# Patient Record
Sex: Male | Born: 1980 | Race: Black or African American | Hispanic: No | Marital: Married | Smoking: Light tobacco smoker
Health system: Southern US, Community
[De-identification: ages and names within clinical notes are randomized; demographics above are authoritative.]

## PROBLEM LIST (undated history)

## (undated) DIAGNOSIS — L509 Urticaria, unspecified: Secondary | ICD-10-CM

## (undated) DIAGNOSIS — K219 Gastro-esophageal reflux disease without esophagitis: Secondary | ICD-10-CM

## (undated) DIAGNOSIS — T7840XA Allergy, unspecified, initial encounter: Secondary | ICD-10-CM

## (undated) HISTORY — DX: Allergy, unspecified, initial encounter: T78.40XA

## (undated) HISTORY — PX: FOOT SURGERY: SHX648

## (undated) HISTORY — PX: TONSILLECTOMY AND ADENOIDECTOMY: SUR1326

## (undated) HISTORY — DX: Gastro-esophageal reflux disease without esophagitis: K21.9

## (undated) HISTORY — DX: Urticaria, unspecified: L50.9

---

## 2008-10-31 ENCOUNTER — Ambulatory Visit: Payer: Self-pay | Admitting: Internal Medicine

## 2008-10-31 DIAGNOSIS — G43109 Migraine with aura, not intractable, without status migrainosus: Secondary | ICD-10-CM | POA: Insufficient documentation

## 2008-10-31 DIAGNOSIS — J3089 Other allergic rhinitis: Secondary | ICD-10-CM | POA: Insufficient documentation

## 2008-10-31 DIAGNOSIS — F411 Generalized anxiety disorder: Secondary | ICD-10-CM | POA: Insufficient documentation

## 2008-11-09 LAB — CONVERTED CEMR LAB
ALT: 28 units/L (ref 0–53)
AST: 32 units/L (ref 0–37)
Albumin: 4.3 g/dL (ref 3.5–5.2)
Eosinophils Relative: 0.7 % (ref 0.0–5.0)
GFR calc non Af Amer: 85.84 mL/min (ref >60)
Glucose, Bld: 89 mg/dL (ref 70–99)
HCT: 49.5 % (ref 39.0–52.0)
Hemoglobin: 16.2 g/dL (ref 13.0–17.0)
LDL Cholesterol: 108 mg/dL — ABNORMAL HIGH (ref 0–99)
Lymphs Abs: 1.9 10*3/uL (ref 0.7–4.0)
Monocytes Relative: 9.4 % (ref 3.0–12.0)
Neutro Abs: 4.9 10*3/uL (ref 1.4–7.7)
Potassium: 4.1 meq/L (ref 3.5–5.1)
Sodium: 140 meq/L (ref 135–145)
TSH: 0.44 microintl units/mL (ref 0.35–5.50)
VLDL: 16.6 mg/dL (ref 0.0–40.0)
WBC: 7.6 10*3/uL (ref 4.5–10.5)

## 2008-11-11 ENCOUNTER — Encounter (INDEPENDENT_AMBULATORY_CARE_PROVIDER_SITE_OTHER): Payer: Self-pay | Admitting: *Deleted

## 2011-12-19 ENCOUNTER — Encounter: Payer: Self-pay | Admitting: Internal Medicine

## 2011-12-19 ENCOUNTER — Ambulatory Visit (INDEPENDENT_AMBULATORY_CARE_PROVIDER_SITE_OTHER): Payer: 59 | Admitting: Internal Medicine

## 2011-12-19 VITALS — BP 132/92 | HR 92 | Wt 235.0 lb

## 2011-12-19 DIAGNOSIS — M542 Cervicalgia: Secondary | ICD-10-CM

## 2011-12-19 DIAGNOSIS — G5600 Carpal tunnel syndrome, unspecified upper limb: Secondary | ICD-10-CM

## 2011-12-19 MED ORDER — TRAMADOL HCL 50 MG PO TABS: 50.0000 mg | ORAL_TABLET | Freq: Three times a day (TID) | ORAL | Status: DC | PRN

## 2011-12-19 MED ORDER — CYCLOBENZAPRINE HCL 5 MG PO TABS: ORAL_TABLET | ORAL | Status: DC

## 2011-12-19 NOTE — Patient Instructions (Addendum)
Use an anti-inflammatory cream such as Aspercreme or Zostrix cream twice a day to the left neck as needed. In lieu of this warm moist compresses or  hot water bottle can be used. Do not apply ice . Use a cervical pillow @ night.  Go to [facility] for information on Carpal Tunnel Syndrome. If symptoms persist or progress; nerve conduction/EMG studies would be indicated. If these were abnormal, Hand Surgery referral would be indicated.

## 2011-12-19 NOTE — Progress Notes (Signed)
Subjective:    Patient ID: Johnny Yang, male    DOB: 08/09/1980, 31 y.o.   MRN: [id number]  HPI Symptoms began approximately 2 weeks ago while he was out of town, upon awakening. The pain was localized in the left neck area and described as sharp. It persisted over several days. It did gradually improve but has been recurring with left lateral neck rotation or neck flexion. There is no history of repetitive motion, overuse, hyperextension, or trauma. He has had some associated left trapezius area pain.    Review of Systems There's been no associated numbness, tingling, or weakness in the extremity. He does have occasional numbness in the left hand.  He is right-handed. His job does not involve repetitive hand motion. There's been no associated fever, chills, or weight loss. He's had no associated bowel or bladder dysfunction.    Additionally she's concerned about a "lump" in the testicular area. It has been present several years but may be enlarging. There is no associated color or temperature change of the skin, rash, dysuria, pyuria, or hematuria.     Objective:   Physical Exam Gen.: Very fit,healthy and well-nourished in appearance. Alert, appropriate and cooperative throughout exam. Head: Normocephalic without obvious abnormalities Eyes: No corneal or conjunctival inflammation noted. Pupils equal round reactive to light and accommodation.  Extraocular motion intact.  Nose: External nasal exam reveals no deformity or inflammation. Nasal mucosa are pink and moist. No lesions or exudates noted.   Mouth: Oral mucosa and oropharynx reveal no lesions or exudates. Teeth in good repair. Neck: No deformities, masses, or tenderness noted. Range of motion DECREASED laterally & with flexion. Thyroid normal. Lungs: Normal respiratory effort; chest expands symmetrically. Lungs are clear to auscultation without rales, wheezes, or increased work of breathing. Heart: Normal rate and rhythm.  Normal S1 and S2. No gallop, click, or rub. S4 w/o murmur. Abdomen: Bowel sounds normal; abdomen soft and nontender. No masses, organomegaly or hernias noted. Genitalia:An agent such as Qvar is a preventive or prophylactic medication used to control inflammation which is a major driving force in asthma. The dose is 2 puffs every 12 hours with acute flares; maintenance dose will be 1 puff daily to  every 12 hours as needed. Gargle & spit after its use When you refill the medications; verify which is the preferred option on your plan.                                                                Musculoskeletal/extremities:There is some asymmetry of the posterior thoracic musculature (L > R) suggesting occult scoliosis.  No clubbing, cyanosis, edema, or deformity noted. Range of motion  normal .Tone & strength  normal.Joints normal. Nail health  good. Vascular: Carotid &radial artery pulses are full and equal. No bruits present. Neurologic: Alert and oriented x3. Deep tendon reflexes symmetrical and normal.  No cervical nerve deficit. + Tinel's bilaterally        Skin: Intact without suspicious lesions or rashes.6X 2.5 cm nevus R abdomen Lymph: No cervical, axillary, or inguinal lymphadenopathy present. Psych: Mood and affect are normal. 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  #4 occult scoliosis unrelated to #1  Plan: See orders and recommendations.

## 2011-12-22 ENCOUNTER — Telehealth: Payer: Self-pay

## 2011-12-22 ENCOUNTER — Ambulatory Visit: Payer: 59 | Attending: Internal Medicine | Admitting: Rehabilitation

## 2011-12-22 DIAGNOSIS — M545 Low back pain, unspecified: Secondary | ICD-10-CM | POA: Insufficient documentation

## 2011-12-22 DIAGNOSIS — IMO0001 Reserved for inherently not codable concepts without codable children: Secondary | ICD-10-CM | POA: Insufficient documentation

## 2011-12-22 DIAGNOSIS — M542 Cervicalgia: Secondary | ICD-10-CM | POA: Insufficient documentation

## 2011-12-22 NOTE — Telephone Encounter (Signed)
Pt left message on triage line wanting lab results. i called pt back concerning results left message.    MW

## 2011-12-26 ENCOUNTER — Ambulatory Visit: Payer: 59 | Admitting: Rehabilitation

## 2011-12-28 ENCOUNTER — Ambulatory Visit: Payer: 59 | Admitting: Rehabilitation

## 2011-12-30 ENCOUNTER — Ambulatory Visit: Payer: 59 | Attending: Internal Medicine | Admitting: Rehabilitation

## 2011-12-30 DIAGNOSIS — IMO0001 Reserved for inherently not codable concepts without codable children: Secondary | ICD-10-CM | POA: Insufficient documentation

## 2011-12-30 DIAGNOSIS — M542 Cervicalgia: Secondary | ICD-10-CM | POA: Insufficient documentation

## 2011-12-30 DIAGNOSIS — M545 Low back pain, unspecified: Secondary | ICD-10-CM | POA: Insufficient documentation

## 2012-01-02 ENCOUNTER — Other Ambulatory Visit: Payer: Self-pay | Admitting: Internal Medicine

## 2012-01-02 DIAGNOSIS — G8929 Other chronic pain: Secondary | ICD-10-CM

## 2012-01-03 ENCOUNTER — Ambulatory Visit: Payer: 59 | Admitting: Rehabilitation

## 2012-01-04 ENCOUNTER — Ambulatory Visit: Payer: 59 | Admitting: Rehabilitation

## 2012-01-05 ENCOUNTER — Telehealth: Payer: Self-pay

## 2012-01-05 NOTE — Telephone Encounter (Signed)
I have contacted patient several times yesterday and today and unable to leave message for recording states mailbox ful. I contact patient's job yesterday and today and was informed patient not in either day.  I called patient's emergency contact (Father) and informed him to please let his son know that we are trying to contact him (Non-urgent) call. Patient's father Peyton Najjar) indicated he will make him aware.   I will continue to try to reach patient tomorrow

## 2012-01-05 NOTE — Telephone Encounter (Signed)
Message copied by Maurice Small on Thu Jan 05, 2012  5:08 PM ------      Message from: Pecola Lawless      Created: Mon Jan 02, 2012  7:03 AM       Please let him know that films of lumbar spine ordered it about our even; no appointment necessary. This was requested by rehab

## 2012-01-06 NOTE — Telephone Encounter (Signed)
Left message on voicemail informing patient orders placed. Patient to call if he has questions or concerns

## 2012-03-08 ENCOUNTER — Encounter: Payer: Self-pay | Admitting: Internal Medicine

## 2012-03-08 ENCOUNTER — Ambulatory Visit (INDEPENDENT_AMBULATORY_CARE_PROVIDER_SITE_OTHER): Payer: 59 | Admitting: Internal Medicine

## 2012-03-08 VITALS — BP 126/98 | HR 80 | Wt 225.2 lb

## 2012-03-08 DIAGNOSIS — L259 Unspecified contact dermatitis, unspecified cause: Secondary | ICD-10-CM

## 2012-03-08 DIAGNOSIS — L299 Pruritus, unspecified: Secondary | ICD-10-CM

## 2012-03-08 MED ORDER — RANITIDINE HCL 150 MG PO TABS: 150.0000 mg | ORAL_TABLET | Freq: Two times a day (BID) | ORAL | Status: DC

## 2012-03-08 MED ORDER — PREDNISONE 20 MG PO TABS: 20.0000 mg | ORAL_TABLET | Freq: Two times a day (BID) | ORAL | Status: DC

## 2012-03-08 MED ORDER — HYDROXYZINE HCL 10 MG PO TABS: 10.0000 mg | ORAL_TABLET | Freq: Four times a day (QID) | ORAL | Status: DC | PRN

## 2012-03-08 NOTE — Progress Notes (Signed)
  Subjective:    Patient ID: Johnny Yang, male    DOB: Sep 19, 1980, 32 y.o.   MRN: [id number]  HPI RASH: Location: arms  Onset: > 4 weeks ago   Course: intermittent Self-treated with: frequent showers       His fiance was in the emergency room last night for diffuse rash over her thorax. No definite etiology was determined                  Review of Systems Pruritis: itching has been constant even when rash not present Tenderness: yes New medications/antibiotics:no Tick/insect/pet exposure:no but questions relationship to petting dog  Recent travel: no New detergent, new clothing, or other topical exposure: no  Red Flags Feeling ill: no Fever/chills/sweats: no Mouth lesions: no Angioedema/ wheezing: some wheezing . No PMH of asthma      Objective:   Physical Exam General appearance:good health ;well nourished; no acute distress or increased work of breathing is present.  No  lymphadenopathy about the head, neck, or axilla noted.   Eyes: No conjunctival inflammation or lid edema is present. There is no scleral icterus.  Ears:  External ear exam shows no significant lesions or deformities.  Some wax bilaterally  Nose:  External nasal examination shows no deformity or inflammation. Nasal mucosa are pink and moist without lesions or exudates. No septal dislocation or deviation.No obstruction to airflow.   Oral exam: Dental hygiene is good; lips and gums are healthy appearing.There is no oropharyngeal erythema or exudate noted.   Neck:  No deformities,  masses, or tenderness noted.     Heart:  Normal rate and regular rhythm. S1 and S2 normal without gallop, murmur, click, rub or other extra sounds.   Lungs:Chest clear to auscultation; no wheezes, rhonchi,rales ,or rubs present.No increased work of breathing.    Extremities:  No cyanosis, edema, or clubbing  noted    Skin: Warm & dry . He has scattered faint excoriations over his back and forearms. Dermatographia  could not be elicited        Assessment & Plan:  #1 intermittent rash over the forearms; his possibly should have possible contact dermatitis related to the dog is quite possible. Vet appt pending  #2 persistent colitis even when rash absent  #3 history of wheezing; no evidence of reactive airways disease or angioedema/urticaria present  Plan: See orders and recommendations

## 2012-03-08 NOTE — Patient Instructions (Addendum)
Avoid colognes and cosmetics which are not hypoallergenic. Restrict hyperallergenic foods at this time: Nuts, strawberries, seafood , chocolate, and tomatoes. Use  Aveeno Daily  Moisturizing Lotion  twice a day  for the dry skin. Bathe with moisturizing liquid soap , not bar soap.

## 2012-11-23 ENCOUNTER — Ambulatory Visit (INDEPENDENT_AMBULATORY_CARE_PROVIDER_SITE_OTHER): Payer: 59 | Admitting: Internal Medicine

## 2012-11-23 ENCOUNTER — Encounter: Payer: Self-pay | Admitting: Internal Medicine

## 2012-11-23 VITALS — BP 129/94 | HR 73 | Temp 98.6°F | Resp 18 | Wt 225.0 lb

## 2012-11-23 DIAGNOSIS — G43909 Migraine, unspecified, not intractable, without status migrainosus: Secondary | ICD-10-CM

## 2012-11-23 DIAGNOSIS — I861 Scrotal varices: Secondary | ICD-10-CM

## 2012-11-23 DIAGNOSIS — G43809 Other migraine, not intractable, without status migrainosus: Secondary | ICD-10-CM

## 2012-11-23 DIAGNOSIS — G43109 Migraine with aura, not intractable, without status migrainosus: Secondary | ICD-10-CM

## 2012-11-23 MED ORDER — SUMATRIPTAN SUCCINATE 100 MG PO TABS: ORAL_TABLET | ORAL | Status: DC

## 2012-11-23 NOTE — Progress Notes (Signed)
Subjective:    Patient ID: Johnny Yang, male    DOB: [birth year], 32 y.o.   MRN: [id number]  HPI  He's had exacerbation of headaches this week; he's had this intermittently over the past several years.  There was no particular trigger or eliciting event for headaches such as prolonged computer use, trauma, change in barometric pressure, or sinus infection  The headaches are mainly in the left temple extending to the left occiput. At times he's had some headache frontally at the base of the nose  The headache is described as sharp, up to a level VII and lasting hours. It is nonradiating  Taking ibuprofen 800 mg one-2 per day and sleeping help the headaches.  He is noted some blurred vision and "stars"  as a prodrome prior to the headache. Light and sound and aggravate headaches.  There is a family history migraine in an uncle   Review of Systems  He denies diplopia or loss of vision. He's had no excess rhinitis or tearing of his eyes. He  Is no associated tinnitus or hearing loss  He has no balance issues or vertigo.  There's been no weakness, numbness, tingling in his arms or legs.  As noted he does not have discolored nasal drainage, fever, or chills. He has intermittent night sweats. No weight loss. ? scrotal "pebbles"; no dysuria , pyuria , or hematuria  There has  been no rash or change in color or temperature in the area of pain.  He denies any bleeding dyscrasias.     Objective:   Physical Exam Gen.: Healthy and well-nourished in appearance. Alert, appropriate and cooperative throughout exam.Appears younger than stated age  Head: Normocephalic without obvious abnormalities  Eyes: No corneal or conjunctival inflammation noted. Pupils equal round reactive to light and accommodation. FOV. Extraocular motion intact. Vision grossly normal without lenses Ears: External  ear exam reveals no significant lesions or deformities. Canals clear .TMs normal. Hearing is  grossly normal bilaterally. Nose: External nasal exam reveals no deformity or inflammation. Nasal mucosa are pink and moist. No lesions or exudates noted.  Mouth: Oral mucosa and oropharynx reveal no lesions or exudates. Teeth in good repair. Neck: No deformities, masses, or tenderness noted. Range of motion & Thyroid normal. Lungs: Normal respiratory effort; chest expands symmetrically. Lungs are clear to auscultation without rales, wheezes, or increased work of breathing. Heart: Normal rate and rhythm. Normal S1 and S2. No gallop, click, or rub. No murmur. Abdomen: Bowel sounds normal; abdomen soft and nontender. No masses, organomegaly or hernias noted. Genitalia: Genitalia normal except for left varices.  Musculoskeletal/extremities: No deformity or scoliosis noted of  the thoracic or lumbar spine.   No clubbing, cyanosis, edema, or significant extremity  deformity noted. Range of motion normal .Tone & strength  Normal. Joints normal . Nail health good. Able to lie down & sit up w/o help. Negative SLR bilaterally Vascular: Carotid, radial artery, dorsalis pedis and  posterior tibial pulses are full and equal. No bruits present. Neurologic: Alert and oriented x3. Deep tendon reflexes symmetrical and normal.  Gait normal; negative Romberg and finger to nose testing    Skin: Intact without suspicious lesions or rashes. Lymph: No cervical, axillary, or inguinal lymphadenopathy present. Psych: Mood and affect are normal. Normally interactive  Assessment & Plan:  #1classic migraine #2 scrotal varices See Orders

## 2012-11-23 NOTE — Patient Instructions (Addendum)
Please keep a diary of your headaches . Document  each occurrence on the calendar with notation of : #1 any prodrome ( any non headache symptom such as marked fatigue,visual changes, ,etc ) which precedes actual headache ; #2) severity on 1-10 scale; #3) any triggers ( food/ drink,enviromenntal or weather changes ,physical or emotional stress) in 8-12 hour period prior to the headache; & #4) response to any medications or other intervention. Please review "Headache" @ [facility] for additional information.    Excedrin Migraine can be used to prevent migraine headaches if taken at the onset of any prodrome or non-headache symptom.

## 2014-12-02 ENCOUNTER — Encounter: Payer: Self-pay | Admitting: Internal Medicine

## 2014-12-02 ENCOUNTER — Ambulatory Visit (INDEPENDENT_AMBULATORY_CARE_PROVIDER_SITE_OTHER): Payer: 59 | Admitting: Internal Medicine

## 2014-12-02 VITALS — BP 112/74 | HR 86 | Temp 98.3°F | Resp 16 | Wt 234.0 lb

## 2014-12-02 DIAGNOSIS — L929 Granulomatous disorder of the skin and subcutaneous tissue, unspecified: Secondary | ICD-10-CM

## 2014-12-02 DIAGNOSIS — L980 Pyogenic granuloma: Secondary | ICD-10-CM

## 2014-12-02 DIAGNOSIS — R1032 Left lower quadrant pain: Secondary | ICD-10-CM

## 2014-12-02 DIAGNOSIS — R1030 Lower abdominal pain, unspecified: Secondary | ICD-10-CM

## 2014-12-02 MED ORDER — TRAMADOL HCL 50 MG PO TABS: 50.0000 mg | ORAL_TABLET | Freq: Three times a day (TID) | ORAL | Status: DC | PRN

## 2014-12-02 MED ORDER — MUPIROCIN 2 % EX OINT: TOPICAL_OINTMENT | CUTANEOUS | Status: DC

## 2014-12-02 NOTE — Progress Notes (Signed)
   Subjective:    Patient ID: Johnny Yang, male    DOB: 1981-02-27, 34 y.o.   MRN: [id number]  HPI  He has had left inguinal groin pain for 2 weeks; it can be as severe as a level VIII. There's been no injury or trauma to explain this. He did have a history of varices in the scrotum @ age of 23. He noted what he thought was an enlarged vein on the left scrotum when he checked himself in the shower.  He has no constitutional, GI or GU symptoms.   Review of Systems Dysuria, pyuria, hematuria, frequency, nocturia or polyuria are denied.Unexplained weight loss, abdominal pain, significant dyspepsia, dysphagia, melena, rectal bleeding, or persistently small caliber stools are denied.    Objective:   Physical Exam Pertinent or positive findings include: There is a subcutaneous granuloma at the base of the left scrotum. There is no vesicle or pustule formation. With compression there is no tenderness. It's approximately pea-sized. He has no associated inguinal lymphadenopathy. He has pain with external rotation of the left hip in the inguinal area.  General appearance :adequately nourished; in no distress.  Eyes: No conjunctival inflammation or scleral icterus is present.   Heart:  Normal rate and regular rhythm. S1 and S2 normal without gallop, murmur, click, rub or other extra sounds    Lungs:Chest clear to auscultation; no wheezes, rhonchi,rales ,or rubs present.No increased work of breathing.   Abdomen: bowel sounds normal, soft and non-tender without masses, organomegaly or hernias noted.  No guarding or rebound. No flank tenderness to percussion.  Vascular : all pulses equal ; no bruits present.  Skin:Warm & dry.  Intact without suspicious lesions or rashes ; no tenting    Lymphatic: No lymphadenopathy is noted about the head, neck, axilla.   Neuro: Strength, tone & DTRs normal.     Assessment & Plan:  #1 left inguinal ligamentous soft tissue injury  #2 granuloma left  scrotum  Plan: See orders and recommendations

## 2014-12-02 NOTE — Patient Instructions (Signed)
  Use the Cybex or Nautilus machines with low weights (25 pounds or less) to stretch the tendon. Performing the frog kick maneuver while holding onto the side of the swimming pool is also recommended.Consider glucosamine sulfate 1500 mg daily for 2-4 weeks. This will rehydrate the tendons & cartilages.

## 2014-12-02 NOTE — Progress Notes (Signed)
Pre visit review using our clinic review tool, if applicable. No additional management support is needed unless otherwise documented below in the visit note. 

## 2015-05-28 ENCOUNTER — Encounter: Payer: Self-pay | Admitting: Family

## 2015-05-28 ENCOUNTER — Ambulatory Visit (INDEPENDENT_AMBULATORY_CARE_PROVIDER_SITE_OTHER): Payer: 59 | Admitting: Family

## 2015-05-28 VITALS — BP 120/88 | HR 92 | Temp 98.0°F | Resp 16 | Ht 74.0 in | Wt 243.0 lb

## 2015-05-28 DIAGNOSIS — N50819 Testicular pain, unspecified: Secondary | ICD-10-CM | POA: Insufficient documentation

## 2015-05-28 MED ORDER — NAPROXEN 500 MG PO TABS: 500.0000 mg | ORAL_TABLET | Freq: Two times a day (BID) | ORAL | Status: DC

## 2015-05-28 NOTE — Progress Notes (Signed)
   Subjective:    Patient ID: Johnny Yang, male    DOB: March 11, 1980, 35 y.o.   MRN: [id number]  Chief Complaint  Patient presents with  . Establish Care    Has some issues with testicles    HPI:  Johnny NoraJohn Allen Yang is a 35 y.o. male who  has a past medical history of Allergy. and presents today  For an office visit.  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 The current problem started about a couple of months ago and describes the associated symptoms of testicular discomfort which is aggravated when he lies on the left side. Pain is described as sharp and achy pain. Believes it started when he was wearing normal boxers believes that aided the problem. He also describes the inability to ejaculate on occasion. No fevers.    No Known Allergies   No current outpatient prescriptions on file prior to visit.   No current facility-administered medications on file prior to visit.     Past Surgical History  Procedure Laterality Date  . Tonsillectomy and adenoidectomy        Review of Systems  Constitutional: Negative for fever and chills.  Respiratory: Negative for chest tightness and shortness of breath.   Genitourinary: Positive for testicular pain. Negative for dysuria, urgency, frequency, hematuria, flank pain, discharge, penile swelling, scrotal swelling and penile pain.      Objective:    BP 120/88 mmHg  Pulse 92  Temp(Src) 98 F (36.7 C) (Oral)  Resp 16  Ht 6\' 2"  (1.88 m)  Wt 243 lb (110.224 kg)  BMI 31.19 kg/m2  SpO2 98% Nursing note and vital signs reviewed.  Physical Exam  Constitutional: He is oriented to person, place, and time. He appears well-developed and well-nourished. No distress.  Cardiovascular: Normal rate, regular rhythm, normal heart sounds and intact distal pulses.   Pulmonary/Chest: Effort normal and breath sounds normal.  Genitourinary: Right testis shows mass. Right testis shows no swelling and no tenderness. Left  testis shows tenderness. Left testis shows no mass and no swelling.  Small firm nodule noted on right testicle.   Neurological: He is alert and oriented to person, place, and time.  Skin: Skin is warm and dry.  Psychiatric: He has a normal mood and affect. His behavior is normal. Judgment and thought content normal.       Assessment & Plan:   Problem List Items Addressed This Visit      Other   Testicular pain - Primary    Symptoms and exam consistent with possible variocele although cannot rule out spermatocele with little concern for torsion. Obtain scrotal ultrasound. Start naproxen as needed for discomfort. Encouraged to wear supportive underwear. Follow-up if symptoms worsen or fail to improve prior to ultrasound results.      Relevant Medications   naproxen (NAPROSYN) 500 MG tablet   Other Relevant Orders   US Scrotum

## 2015-05-28 NOTE — Progress Notes (Signed)
Pre visit review using our clinic review tool, if applicable. No additional management support is needed unless otherwise documented below in the visit note. 

## 2015-05-28 NOTE — Assessment & Plan Note (Addendum)
Symptoms and exam consistent with possible variocele although cannot rule out spermatocele with little concern for torsion. Obtain scrotal ultrasound. Start naproxen as needed for discomfort. Encouraged to wear supportive underwear. Follow-up if symptoms worsen or fail to improve prior to ultrasound results.

## 2015-05-28 NOTE — Patient Instructions (Signed)
Thank you for choosing [facility].  Summary/Instructions:  Please start naproxen 2x daily.  Wear supportive underwear.   Your prescription(s) have been submitted to your pharmacy or been printed and provided for you. Please take as directed and contact our office if you believe you are having problem(s) with the medication(s) or have any questions.  If your symptoms worsen or fail to improve, please contact our office for further instruction, or in case of emergency go directly to the emergency room at the closest medical facility.   Varicocele A varicocele is a swelling of veins in the scrotum. The scrotum is the sac that contains the testicles. Varicoceles can occur on either side of the scrotum, but they are more common on the left side. They occur most often in teenage boys and young men. In most cases, varicoceles are not a serious problem. They are usually small and painless and do not require treatment. Tests may be done to confirm the diagnosis. Treatment may be needed if:  A varicocele is large, causes a lot of pain, or causes pain when exercising.  Varicoceles are found on both sides of the scrotum.  The testicle on the opposite side is absent or not normal.  A varicocele causes a decrease in the size of the testicle in a growing adolescent.  The person has fertility problems. CAUSES This condition is the result of valves in the veins not working properly. Valves in the veins help to return blood from the scrotum and testicles to the heart. If these valves do not work well, blood flows backward and backs up into the veins, which causes the veins to swell. This is similar to what happens when varicose veins form in the leg. SYMPTOMS Most varicoceles do not cause any symptoms. If symptoms do occur, they may include:  Swelling on one side of the scrotum. The swelling may be more obvious when you are standing up.  A lumpy feeling in the scrotum.  A heavy feeling on one  side of the scrotum.  A dull ache in the scrotum, especially after exercise or prolonged standing or sitting.  Slower growth or reduced size of the testicle on the side of the varicocele (in young males).  Problems with fertility. These can occur if the testicle does not grow normally. DIAGNOSIS This condition may be diagnosed with a physical exam. You may also have an imaging test, called an ultrasound, to confirm the diagnosis and to help rule out other causes of the swelling. TREATMENT Treatment is usually not needed for this condition. If you have any pain, your health care provider may prescribe or recommend medicine to help relieve it. You may need regular exams so your health care provider can monitor the varicocele to ensure that it does not cause problems. When further treatment is needed, it may involve one of these options:  Varicocelectomy. This is a surgery in which the swollen veins are tied off so that the flow of blood goes to other veins instead.  Embolization. In this procedure, a small tube (catheter) is used to place metal coils or other blocking items in the veins. This cuts off the blood flow to the swollen veins. HOME CARE INSTRUCTIONS  Take medicines only as directed by your health care provider.  Wear supportive underwear.  Use an athletic supporter for sports.  Keep all follow-up visits as directed by your health care provider. This is important. SEEK MEDICAL CARE IF:  Your pain is increasing.  You have redness  in the affected area.  You have swelling that does not decrease when you are lying down.  One of your testicles is smaller than the other.  Your testicle becomes enlarged, swollen, or painful.   This information is not intended to replace advice given to you by your health care provider. Make sure you discuss any questions you have with your health care provider.   Document Released: 05/23/2000 Document Revised: 07/01/2014 Document Reviewed:  01/22/2014 Elsevier Interactive Patient Education 2016 Elsevier Inc.  Scrotal Masses Scrotal swelling is common in men of all ages. Common types of testicular masses include:   Hydrocele. The most common benign testicular mass in an adult. Hydroceles are generally soft and painless collections of fluid in the scrotal sac. These can rapidly change size as the fluid enters or leaves. Hydroceles can be associated with an underlying cancer of the testicle.  Spermatoceles. Generally soft and painless cyst-like masses in the scrotum that contain fluid, usually above the testicle. They can rapidly change size as the fluid enters or leaves. They are more prominent while standing or exercising. Sometimes, spermatoceles may cause a sensation of heaviness or a dull ache.  Orchitis. Inflammation of the testicle. It is painful and may be associated with a fever or symptoms of a urinary tract infection, including frequent and painful urination. It is common in males who have the mumps.  Varicocele. An enlargement of the veins that drain the testicles. Varicoceles usually occur on the left side of the scrotum. This condition can increase the risk of infertility. Varicocele is sometimes more prominent while standing or exercising. Sometimes, varicoceles may cause a sensation of heaviness or a dull ache.  Inguinal hernia. A bulge caused by a portion of intestine protruding into the scrotum through a weak area in the abdominal muscles. Hernias may or may not be painful. They are soft and usually enlarge with coughing or straining.  Torsion of the testis. This can cause a testicular mass that develops quickly and is associated with tenderness or fever, or both. It is caused by a twisting of the testicle within the sac. It also reduces the blood supply and can destroy the testis if not treated quickly with surgery.  Epididymitis. Inflammation of the epididymis (a structure attached above and behind the testicle),  usually caused by a urinary tract infection or a sexually transmitted infection. This generally shows up as testicular discomfort and swelling and may include pain during urination. It is frequently associated with a testicle infection.  Testicular appendages. Remnants of tissue on the testis present since birth. A testicular appendage can twist on its blood supply and cause pain. In most cases, this is seen as a blue dot on the scrotum.  Hematocele. A collection of blood between the layers of the sac inside the scrotum. It usually is caused by trauma to the scrotum.  Sebaceous cysts. These can be a swelling in the skin of the scrotum and are usually painless.  Cancer (carcinoma) of the skin of the scrotum. It can cause scrotal swelling, but this is rare.   This information is not intended to replace advice given to you by your health care provider. Make sure you discuss any questions you have with your health care provider.   Document Released: 08/21/2002 Document Revised: 10/17/2012 Document Reviewed: 08/06/2012 Elsevier Interactive Patient Education [employer]2016 Elsevier Inc.

## 2015-06-03 ENCOUNTER — Telehealth: Payer: Self-pay | Admitting: Family

## 2015-06-03 ENCOUNTER — Other Ambulatory Visit: Payer: 59

## 2015-06-03 ENCOUNTER — Other Ambulatory Visit: Payer: Self-pay | Admitting: Family

## 2015-06-03 ENCOUNTER — Ambulatory Visit
Admission: RE | Admit: 2015-06-03 | Discharge: 2015-06-03 | Disposition: A | Discharge status: Home / Self Care | Payer: 59 | Source: Ambulatory Visit | Attending: Family | Admitting: Family

## 2015-06-03 DIAGNOSIS — N50819 Testicular pain, unspecified: Secondary | ICD-10-CM

## 2015-06-03 NOTE — Telephone Encounter (Signed)
Please inform patient that his ultrasound was negative for any masses however he did have a variocele on the left side which is an enlargement of the veins. No further action is required at this time. If he continues to experience pain we can send him to urology.

## 2015-06-03 NOTE — Telephone Encounter (Signed)
Please advise 

## 2015-06-03 NOTE — Telephone Encounter (Signed)
Is requesting call back with ultrasound results

## 2015-06-04 MED ORDER — CIPROFLOXACIN HCL 500 MG PO TABS: 500.0000 mg | ORAL_TABLET | Freq: Two times a day (BID) | ORAL | Status: DC

## 2015-06-04 NOTE — Telephone Encounter (Signed)
Referral to urology placed and Ciprofloxacin sent to pharmacy.

## 2015-06-04 NOTE — Telephone Encounter (Signed)
Patient called in for the results of his ultrasound.

## 2015-06-04 NOTE — Telephone Encounter (Signed)
Pt is still having complications with ejaculation and continues to have a dull achy pain all day in the penis. No urinary retention. Pt is asking what he needs to do now.

## 2015-06-05 NOTE — Telephone Encounter (Signed)
Pt aware.

## 2015-12-07 ENCOUNTER — Encounter: Payer: Self-pay | Admitting: Physician Assistant

## 2015-12-07 ENCOUNTER — Telehealth: Payer: Self-pay | Admitting: Family

## 2015-12-07 ENCOUNTER — Ambulatory Visit (INDEPENDENT_AMBULATORY_CARE_PROVIDER_SITE_OTHER): Payer: 59 | Admitting: Physician Assistant

## 2015-12-07 ENCOUNTER — Encounter: Payer: Self-pay | Admitting: *Deleted

## 2015-12-07 VITALS — BP 142/96 | HR 83 | Temp 98.1°F | Resp 16 | Ht 74.0 in | Wt 235.0 lb

## 2015-12-07 DIAGNOSIS — M722 Plantar fascial fibromatosis: Secondary | ICD-10-CM

## 2015-12-07 DIAGNOSIS — M79674 Pain in right toe(s): Secondary | ICD-10-CM

## 2015-12-07 MED ORDER — MELOXICAM 15 MG PO TABS: 15.0000 mg | ORAL_TABLET | Freq: Every day | ORAL | 0 refills | Status: DC

## 2015-12-07 NOTE — Progress Notes (Signed)
Pre visit review using our clinic review tool, if applicable. No additional management support is needed unless otherwise documented below in the visit note/SLS  

## 2015-12-07 NOTE — Telephone Encounter (Signed)
Patient Name: Johnny Yang  DOB: 28-Dec-1980    Initial Comment Caller states he's having foot pain, doesn't know his doctors name.   Nurse Assessment  Nurse: Stefano GaulStringer, RN, Dwana CurdVera Date/Time (Eastern Time): 12/07/2015 2:48:02 PM  Confirm and document reason for call. If symptomatic, describe symptoms. You must click the next button to save text entered. ---Caller states he is having pain in both feet. Left foot hurts worse. May be a little red. Has had pain for over a week. Right great toe has limited range of motion. He can walk but it hurts.  Has the patient traveled out of the country within the last 30 days? ---Not Applicable  Does the patient have any new or worsening symptoms? ---Yes  Will a triage be completed? ---Yes  Related visit to physician within the last 2 weeks? ---No  Does the PT have any chronic conditions? (i.e. diabetes, asthma, etc.) ---No  Is this a behavioral health or substance abuse call? ---No     Guidelines    Guideline Title Affirmed Question Affirmed Notes  Foot Pain [1] Redness of the skin AND [2] no fever    Final Disposition User   See Physician within 24 Hours EwingStringer, RN, Vera    Comments  No appts available at [facility]. Appt scheduled for 12/07/15 with [facility]oganody [facility]   Referrals  REFERRED TO PCP OFFICE   Disagree/Comply: Danella Maiersomply

## 2015-12-07 NOTE — Patient Instructions (Signed)
Please take the Mobic once daily with food. Wear appropriate sized shoes.  Elevate feet while resting. Ice to help with inflammation and swelling.  Follow-up if symptoms are not resolving as we will need lab work and imaging.

## 2015-12-11 ENCOUNTER — Telehealth: Payer: Self-pay | Admitting: Family

## 2015-12-11 DIAGNOSIS — M79671 Pain in right foot: Secondary | ICD-10-CM

## 2015-12-11 NOTE — Telephone Encounter (Signed)
Caller name:Ianmichael Slone Relationship to patient:self Can be [phone] Pharmacy:  Reason for call:Patient was seen on 12/07/15, left foot pain is better but right foot is not any better. Requesting xray

## 2015-12-13 NOTE — Telephone Encounter (Signed)
Please apologize for the delay.  I am out of office and have limited access to my in-basket.  X-ray has been ordered. He can come to [facility] department to have completed.

## 2015-12-14 NOTE — Telephone Encounter (Signed)
Lm on vm awaiting return call °

## 2015-12-15 ENCOUNTER — Ambulatory Visit (HOSPITAL_BASED_OUTPATIENT_CLINIC_OR_DEPARTMENT_OTHER)
Admission: RE | Admit: 2015-12-15 | Discharge: 2015-12-15 | Disposition: A | Discharge status: Home / Self Care | Payer: 59 | Source: Ambulatory Visit | Attending: Physician Assistant | Admitting: Physician Assistant

## 2015-12-15 ENCOUNTER — Telehealth: Payer: Self-pay | Admitting: Family

## 2015-12-15 DIAGNOSIS — M19071 Primary osteoarthritis, right ankle and foot: Secondary | ICD-10-CM | POA: Insufficient documentation

## 2015-12-15 DIAGNOSIS — M79671 Pain in right foot: Secondary | ICD-10-CM | POA: Diagnosis not present

## 2015-12-15 DIAGNOSIS — M19079 Primary osteoarthritis, unspecified ankle and foot: Secondary | ICD-10-CM

## 2015-12-15 NOTE — Telephone Encounter (Signed)
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 Would recommend a uric acid level to r/o chronic gouty arthritis. If negative, may need Ortho/Podiatry input

## 2015-12-15 NOTE — Telephone Encounter (Signed)
Relation to WU:JWJXpt:self Call back [phone]   Reason for call:  Patient inquiring about imaging results.

## 2015-12-15 NOTE — Telephone Encounter (Signed)
Patient informed, understood & agreed; future lab order placed for 12/16/15 at 7:00am/SLS 10/17

## 2015-12-16 ENCOUNTER — Other Ambulatory Visit (INDEPENDENT_AMBULATORY_CARE_PROVIDER_SITE_OTHER): Payer: 59

## 2015-12-16 DIAGNOSIS — M19079 Primary osteoarthritis, unspecified ankle and foot: Secondary | ICD-10-CM

## 2015-12-16 LAB — URIC ACID: Uric Acid, Serum: 7.4 mg/dL (ref 4.0–7.8)

## 2015-12-17 NOTE — Telephone Encounter (Signed)
Pt returned call for results.    CB: [phone]

## 2015-12-17 NOTE — Telephone Encounter (Signed)
Patient notified of results and verbalized understanding.  

## 2015-12-18 ENCOUNTER — Other Ambulatory Visit: Payer: Self-pay | Admitting: Physician Assistant

## 2015-12-18 DIAGNOSIS — G8929 Other chronic pain: Secondary | ICD-10-CM

## 2015-12-18 DIAGNOSIS — M79671 Pain in right foot: Principal | ICD-10-CM

## 2015-12-20 NOTE — Progress Notes (Signed)
    Patient presents to clinic today c/o 1 week of pain of feet bilaterally. Endorses pain in R foot, located in R great toe. Is an aching pain at rest, sharp with ambulation. Worse in the morning but present throughout the day. Endorses swelling but denies redness. Denies hx of gout. Denies injury, numbness, tingling, pallor, coldness or bruising. Pain of L foot is described as on the plantar surface, laterally. Denies twisting injury, fall or trauma. Denies swelling, redness or bruising. Is worse with ambulation and alleviated by rest.   Past Medical History:  Diagnosis Date  . Allergy    seasonal    No current outpatient prescriptions on file prior to visit.   No current facility-administered medications on file prior to visit.     No Known Allergies  Family History  Problem Relation Age of Onset  . Cancer Paternal Aunt     ovarian    Social History   Social History  . Marital status: Single    Spouse name: N/A  . Number of children: N/A  . Years of education: N/A   Social History Main Topics  . Smoking status: Current Every Day Smoker  . Smokeless tobacco: None     Comment: 1 cigar / day  . Alcohol use Yes     Comment:  occasionally as 2-3 beverages 2-3 X/ week  . Drug use: No  . Sexual activity: Not Asked   Other Topics Concern  . None   Social History Narrative  . None    Review of Systems - See HPI.  All other ROS are negative.  BP (!) 142/96 (BP Location: Right Arm, Patient Position: Sitting, Cuff Size: Large)   Pulse 83   Temp 98.1 F (36.7 C) (Oral)   Resp 16   Ht 6\' 2"  (1.88 m)   Wt 235 lb (106.6 kg)   SpO2 98%   BMI 30.17 kg/m   Physical Exam  Constitutional: He is oriented to person, place, and time and well-developed, well-nourished, and in no distress.  HENT:  Head: Normocephalic and atraumatic.  Eyes: Conjunctivae are normal.  Cardiovascular: Normal rate, regular rhythm, normal heart sounds and intact distal pulses.   Pulmonary/Chest:  Effort normal and breath sounds normal.  Musculoskeletal:       Feet:  Neurological: He is alert and oriented to person, place, and time.  Skin: Skin is warm and dry. No rash noted.  Psychiatric: Affect normal.  Vitals reviewed.   Recent Results (from the past 2160 hour(s))  Uric acid     Status: None   Collection Time: 12/16/15  7:29 AM  Result Value Ref Range   Uric Acid, Serum 7.4 4.0 - 7.8 mg/dL    Assessment/Plan: 1. Plantar fasciitis Supportive measures. RICE discussed. Rx Mobic. Supportive footwear to be worn at all times. Arch supports recommended. FU if not resolving.  2. Great toe pain, right Question gouty arthritis. No sign of residual acute gout flare. Supportive measures reviewed. Rx Mobic as directed. FU PRN.   Piedad ClimesMartin, Aquiles Ruffini Cody, PA-C

## 2015-12-22 ENCOUNTER — Ambulatory Visit (HOSPITAL_BASED_OUTPATIENT_CLINIC_OR_DEPARTMENT_OTHER): Payer: 59

## 2015-12-22 ENCOUNTER — Encounter: Payer: Self-pay | Admitting: Podiatry

## 2015-12-22 ENCOUNTER — Ambulatory Visit (HOSPITAL_BASED_OUTPATIENT_CLINIC_OR_DEPARTMENT_OTHER)
Admission: RE | Admit: 2015-12-22 | Discharge: 2015-12-22 | Disposition: A | Discharge status: Home / Self Care | Payer: 59 | Source: Ambulatory Visit | Attending: Podiatry | Admitting: Podiatry

## 2015-12-22 ENCOUNTER — Ambulatory Visit (INDEPENDENT_AMBULATORY_CARE_PROVIDER_SITE_OTHER): Payer: 59 | Admitting: Podiatry

## 2015-12-22 DIAGNOSIS — R52 Pain, unspecified: Secondary | ICD-10-CM

## 2015-12-22 DIAGNOSIS — M205X1 Other deformities of toe(s) (acquired), right foot: Secondary | ICD-10-CM

## 2015-12-22 DIAGNOSIS — M21619 Bunion of unspecified foot: Secondary | ICD-10-CM

## 2015-12-22 DIAGNOSIS — M79671 Pain in right foot: Secondary | ICD-10-CM | POA: Insufficient documentation

## 2015-12-22 DIAGNOSIS — M19071 Primary osteoarthritis, right ankle and foot: Secondary | ICD-10-CM | POA: Insufficient documentation

## 2015-12-22 NOTE — Patient Instructions (Signed)
Hallux Rigidus Hallux rigidus is a condition involving pain and a loss of motion of the first (big) toe. The pain gets worse with lifting up (extension) of the toe. This is usually due to arthritic bony bumps (spurring) of the joint at the base of the big toe.  SYMPTOMS   Pain, with lifting up of the toe.  Tenderness over the joint where the big toe meets the foot.  Redness, swelling, and warmth over the top of the base of the big toe (sometimes).  Foot pain, stiffness, and limping. CAUSES  Hallux rigidus is caused by arthritis of the joint where the big toe meets the foot. The arthritis creates a bone spur that pinches the soft tissues when the toe is extended. RISK INCREASES WITH:  Tight shoes with a narrow toe box.  Family history of foot problems.  Gout and rheumatoid and psoriatic arthritis.  History of previous toe injury, including "turf toe."  Long first toe, flat feet, and other big toe bony bumps.  Arthritis of the big toe. PREVENTION   Wear wide-toed shoes that fit well.  Tape the big toe to reduce motion and to prevent pinching of the tissues between the bone.  Maintain physical fitness:  Foot and ankle flexibility.  Muscle strength and endurance. PROGNOSIS  This condition can usually be managed with proper treatment. However, surgery is typically required to prevent the problem from recurring.  RELATED COMPLICATIONS  Injury to other areas of the foot or ankle, caused by abnormal walking in an attempt to avoid the pain felt when walking normally. TREATMENT Treatment first involves stopping the activities that aggravate your symptoms. Ice and medicine can be used to reduce the pain and inflammation. Modifications to shoes may help reduce pain, including wearing stiff-soled shoes, shoes with a wide toe box, inserting a padded donut to relieve pressure on top of the joint, or wearing an arch support. Corticosteroid injections may be given to reduce inflammation. If  nonsurgical treatment is unsuccessful, surgery may be needed. Surgical options include removing the arthritic bony spur, cutting a bone in the foot to change the arc of motion (allowing the toe to extend more), or fusion of the joint (eliminating all motion in the joint at the base of the big toe).  MEDICATION   If pain medicine is needed, nonsteroidal anti-inflammatory medicines (aspirin and ibuprofen), or other minor pain relievers (acetaminophen), are often advised.  Do not take pain medicine for 7 days before surgery.  Prescription pain relievers are usually prescribed only after surgery. Use only as directed and only as much as you need.  Ointments for arthritis, applied to the skin, may give some relief.  Injections of corticosteroids may be given to reduce inflammation. HEAT AND COLD  Cold treatment (icing) relieves pain and reduces inflammation. Cold treatment should be applied for 10 to 15 minutes every 2 to 3 hours, and immediately after activity that aggravates your symptoms. Use ice packs or an ice massage.  Heat treatment may be used before performing the stretching and strengthening activities prescribed by your caregiver, physical therapist, or athletic trainer. Use a heat pack or a warm water soak. SEEK MEDICAL CARE IF:   Symptoms get worse or do not improve in 2 weeks, despite treatment.  After surgery you develop fever, increasing pain, redness, swelling, drainage of fluids, bleeding, or increasing warmth.  New, unexplained symptoms develop. (Drugs used in treatment may produce side effects.)   This information is not intended to replace advice given to   you by your health care provider. Make sure you discuss any questions you have with your health care provider.   Document Released: 02/14/2005 Document Revised: 03/07/2014 Document Reviewed: 05/29/2008 Elsevier Interactive Patient Education 2016 [employer]  Pre-Operative Instructions  Congratulations, you have  decided to take an important step to improving your quality of life.  You can be assured that the doctors of [facility] will be with you every step of the way.  1. Plan to be at the surgery center/hospital at least 1 (one) hour prior to your scheduled time unless otherwise directed by the surgical center/hospital staff.  You must have a responsible adult accompany you, remain during the surgery and drive you home.  Make sure you have directions to the surgical center/hospital and know how to get there on time. 2. For hospital based surgery you will need to obtain a history and physical form from your family physician within 1 month prior to the date of surgery- we will give you a form for you primary physician.  3. We make every effort to accommodate the date you request for surgery.  There are however, times where surgery dates or times have to be moved.  We will contact you as soon as possible if a change in schedule is required.   4. No Aspirin/Ibuprofen for one week before surgery.  If you are on aspirin, any non-steroidal anti-inflammatory medications (Mobic, Aleve, Ibuprofen) you should stop taking it 7 days prior to your surgery.  You make take Tylenol  For pain prior to surgery.  5. Medications- If you are taking daily heart and blood pressure medications, seizure, reflux, allergy, asthma, anxiety, pain or diabetes medications, make sure the surgery center/hospital is aware before the day of surgery so they may notify you which medications to take or avoid the day of surgery. 6. No food or drink after midnight the night before surgery unless directed otherwise by surgical center/hospital staff. 7. No alcoholic beverages 24 hours prior to surgery.  No smoking 24 hours prior to or 24 hours after surgery. 8. Wear loose pants or shorts- loose enough to fit over bandages, boots, and casts. 9. No slip on shoes, sneakers are best. 10. Bring your boot with you to the surgery center/hospital.  Also  bring crutches or a walker if your physician has prescribed it for you.  If you do not have this equipment, it will be provided for you after surgery. 11. If you have not been contracted by the surgery center/hospital by the day before your surgery, call to confirm the date and time of your surgery. 12. Leave-time from work may vary depending on the type of surgery you have.  Appropriate arrangements should be made prior to surgery with your employer. 13. Prescriptions will be provided immediately following surgery by your doctor.  Have these filled as soon as possible after surgery and take the medication as directed. 14. Remove nail polish on the operative foot. 15. Wash the night before surgery.  The night before surgery wash the foot and leg well with the antibacterial soap provided and water paying special attention to beneath the toenails and in between the toes.  Rinse thoroughly with water and dry well with a towel.  Perform this wash unless told not to do so by your physician.  Enclosed: 1 Ice pack (please put in freezer the night before surgery)   1 Hibiclens skin cleaner   Pre-op Instructions  If you have any questions regarding the instructions,  do not hesitate to call our office at any point during this process.   [place]: [address] [place], [place] [address] [phone]  [place]: [address], [place], [place] [address] [phone]  Dr. Ovid Curd, DPM

## 2015-12-22 NOTE — Progress Notes (Signed)
Subjective:    Patient ID: Johnny Yang, male    DOB: February 27, 1981, 35 y.o.   MRN: [id number]  HPI  35 year old male presents the office they for concerns of right big toe joint pain which is been ongoing for several months. He states that the toe hurts quite a bit when he tries to bend his toe. He states it hurts with shoes and without shoes. He states that he is tried changing shoes without any significant relief of symptoms. He states that when he works he starts to get quite a bit of pain to the toe joint towards the end of the day with working. He states this is limiting his activity due to the pain. He did have blood work recently which did not reveal any increased uric acid. He has been taking anti-inflammatories which helps some but still continues to have pain. Denies any recent injury or trauma. No other treatments. No other complaints at this time.  Review of Systems  All other systems reviewed and are negative.      Objective:   Physical Exam General: AAO x3, NAD  Dermatological: Skin is warm, dry and supple bilateral. Nails x 10 are well manicured; remaining integument appears unremarkable at this time. There are no open sores, no preulcerative lesions, no rash or signs of infection present.  Vascular: Dorsalis Pedis artery and Posterior Tibial artery pedal pulses are 2/4 bilateral with immedate capillary fill time. There is no pain with calf compression, swelling, warmth, erythema.   Neruologic: Grossly intact via light touch bilateral. Vibratory intact via tuning fork bilateral. Protective threshold with Semmes Wienstein monofilament intact to all pedal sites bilateral.   Musculoskeletal: There is decreased range of motion the right first MTPJ has mild crepitation with joint range of motion. There is dorsal exostosis palpable the dorsal lateral dorsal medial aspect of the joint as well as mild HAV is present as well. There is trace edema along this area without any  erythema or increase in warmth. There is pain with range of motion of the right first MTPJ. There is no hypermobility of the first ray. There is no other areas of tenderness at this time. MMT 5/5. Range of motion intact elsewhere.  Gait: Unassisted, Nonantalgic.     Assessment & Plan:  35 year old male right hallux limitus -Treatment options discussed including all alternatives, risks, and complications -Etiology of symptoms were discussed -X-rays x-rays were reviewed with the patient. I ordered weightbearing x-rays today. -At this time I discussed with him both conservative and surgical treatment options. Discussed some shoe gear medications, offloading padding as well as injection therapy. However given that he is artery having significant pain and decreased range of motion the first MTPJ is at the level I discussed surgical intervention. I discussed with him osteotomy to decompress the joint of the first metatarsal as well as to remove the bone spurs. I discussed with him the surgery as well as the postoperative course. -The incision placement as well as the postoperative course was discussed with the patient. I discussed risks of the surgery which include, but not limited to, infection, bleeding, pain, swelling, need for further surgery, delayed or nonhealing, painful or ugly scar, numbness or sensation changes, over/under correction, recurrence, transfer lesions, further deformity, hardware failure, DVT/PE, loss of toe/foot. Patient understands these risks and wishes to proceed with surgery. The surgical consent was reviewed with the patient all 3 pages were signed. No promises or guarantees were given to the outcome of the procedure.  All questions were answered to the best of my ability. Before the surgery the patient was encouraged to call the office if there is any further questions. [facility] basis.  Ovid CurdMatthew Akirra Lacerda, DPM .

## 2015-12-25 ENCOUNTER — Telehealth: Payer: Self-pay | Admitting: *Deleted

## 2015-12-25 NOTE — Telephone Encounter (Addendum)
"  I had an appointment today with [place] [place] [facility].  He told me to call you all today to schedule an appointment for surgery.  You can call me back on my cell phone.  I'd appreciate it."  I attempted to call patient with no success.

## 2015-12-25 NOTE — Telephone Encounter (Addendum)
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 If he has any further questions to let me know. Will proceed with the surgery as scheduled. If he has any further questions, please let me know. 12/25/2015- Dr.Wagoner's review of results and orders were left as a message for pt.

## 2015-12-28 NOTE — Telephone Encounter (Signed)
"  I'm calling back from a message left on Friday.  I think I need to speak to you to set up a surgery date."

## 2015-12-29 NOTE — Telephone Encounter (Signed)
"  I been calling since Friday.  I would like for somebody to contact me in reference to scheduling a surgery date with Dr. Ardelle AntonWagoner.  I was told and referred to call this number to set up a date.  Someone called me on Friday but I missed the call.  Please give me a call on my cell phone.  Thank you."    I am calling you to set up your surgery.  Do you have a date in mind?  "Yes, I would like to do it on November 15th or the 8th."  He can do it on November 15th.  "That will be fine."  You can go ahead and register with the surgical center, instructions are in the brochure.  Someone from the surgical center will call with the arrival time on the Friday before surgery date.  "Okay, thank you."

## 2016-01-13 ENCOUNTER — Encounter: Payer: Self-pay | Admitting: Podiatry

## 2016-01-13 DIAGNOSIS — L97501 Non-pressure chronic ulcer of other part of unspecified foot limited to breakdown of skin: Secondary | ICD-10-CM | POA: Diagnosis not present

## 2016-01-13 DIAGNOSIS — M2011 Hallux valgus (acquired), right foot: Secondary | ICD-10-CM | POA: Diagnosis not present

## 2016-01-14 ENCOUNTER — Encounter: Payer: Self-pay | Admitting: Podiatry

## 2016-01-14 ENCOUNTER — Telehealth: Payer: Self-pay | Admitting: *Deleted

## 2016-01-14 MED ORDER — IBUPROFEN 800 MG PO TABS: 800.0000 mg | ORAL_TABLET | Freq: Three times a day (TID) | ORAL | 1 refills | Status: DC | PRN

## 2016-01-14 NOTE — Telephone Encounter (Signed)
Pt requested Ibuprofen to assist with post op pain. Left message informing pt the [facility] [id number], and to call with concerns.

## 2016-01-19 ENCOUNTER — Ambulatory Visit (HOSPITAL_BASED_OUTPATIENT_CLINIC_OR_DEPARTMENT_OTHER)
Admission: RE | Admit: 2016-01-19 | Discharge: 2016-01-19 | Disposition: A | Discharge status: Home / Self Care | Payer: 59 | Source: Ambulatory Visit | Attending: Podiatry | Admitting: Podiatry

## 2016-01-19 ENCOUNTER — Ambulatory Visit (INDEPENDENT_AMBULATORY_CARE_PROVIDER_SITE_OTHER): Payer: 59 | Admitting: Podiatry

## 2016-01-19 ENCOUNTER — Encounter: Payer: Self-pay | Admitting: Podiatry

## 2016-01-19 VITALS — Temp 98.7°F

## 2016-01-19 DIAGNOSIS — Z09 Encounter for follow-up examination after completed treatment for conditions other than malignant neoplasm: Secondary | ICD-10-CM | POA: Insufficient documentation

## 2016-01-19 DIAGNOSIS — M205X1 Other deformities of toe(s) (acquired), right foot: Secondary | ICD-10-CM | POA: Insufficient documentation

## 2016-01-19 DIAGNOSIS — Z9889 Other specified postprocedural states: Secondary | ICD-10-CM | POA: Diagnosis not present

## 2016-01-19 NOTE — Progress Notes (Signed)
Subjective: Johnny Yang is a 35 y.o. is seen today in office s/p right foot Bi-plantar Austin bunionecotmy preformed on 01/13/16 for hallux limitus/HAV. He also had subchondral drilling of the metatarsal head due to cartilage loss and application of an antibiotic graft upon the metatarsal head. He states his pain has been improving he is not taking pain medicine the last 2 days. He has been taking ibuprofen. He is continued to try to stay nonweightbearing as much as possible. He has been taking keflex. Denies any systemic complaints such as fevers, chills, nausea, vomiting. No calf pain, chest pain, shortness of breath.   Objective: General: No acute distress, AAOx3  DP/PT pulses palpable 2/4, CRT < 3 sec to all digits.  Protective sensation intact. Motor function intact.  Right foot: Incision is well coapted without any evidence of dehiscence and suture ends intact. There is no surrounding erythema, ascending cellulitis, fluctuance, crepitus, malodor, drainage/purulence. There is mild to moderate edema around the surgical site. There is mild pain along the surgical site.  No other areas of tenderness to bilateral lower extremities.  No other open lesions or pre-ulcerative lesions.  No pain with calf compression, swelling, warmth, erythema.   Assessment and Plan:  Status post right foot surgery, doing well with no complications   -Treatment options discussed including all alternatives, risks, and complications -X-rays were obtained and reviewed with the patient. Hardware intact. Status post osteotomy the first metatarsal. No other evidence of acute fracture identified. -Ice/elevation -Pain medication as needed. -WBAT in cam boot. -Rx for new crutches as the ones he has do not have a metal bottom and scared he is going to fall.  -Monitor for any clinical signs or symptoms of infection and DVT/PE and directed to call the office immediately should any occur or go to the ER. -Follow-up in 1  week for possible suture removal or sooner if any problems arise. In the meantime, encouraged to call the office with any questions, concerns, change in symptoms.   Ovid CurdMatthew Wagoner, DPM

## 2016-01-26 ENCOUNTER — Encounter: Payer: Self-pay | Admitting: Podiatry

## 2016-01-26 ENCOUNTER — Ambulatory Visit (INDEPENDENT_AMBULATORY_CARE_PROVIDER_SITE_OTHER): Payer: 59 | Admitting: Podiatry

## 2016-01-26 DIAGNOSIS — Z09 Encounter for follow-up examination after completed treatment for conditions other than malignant neoplasm: Secondary | ICD-10-CM

## 2016-01-26 DIAGNOSIS — M205X1 Other deformities of toe(s) (acquired), right foot: Secondary | ICD-10-CM

## 2016-01-26 MED ORDER — IBUPROFEN 800 MG PO TABS: 800.0000 mg | ORAL_TABLET | Freq: Three times a day (TID) | ORAL | 1 refills | Status: DC | PRN

## 2016-01-26 NOTE — Progress Notes (Signed)
Subjective: Johnny Yang is a 35 y.o. is seen today in office s/p right foot Bi-plantar Austin bunionecotmy preformed on 01/13/16 for hallux limitus/HAV. He also had subchondral drilling of the metatarsal head due to cartilage loss and application of an antibiotic graft upon the metatarsal head. He is taking ibuprofen for pain and not requiring any narcotic pain medicine. He's continuing the CAM boot and nonweightbearing. His been putting some weight to his foot. He has tried moving the toe some "accidently".  Denies any systemic complaints such as fevers, chills, nausea, vomiting. No calf pain, chest pain, shortness of breath.   Objective: General: No acute distress, AAOx3  DP/PT pulses palpable 2/4, CRT < 3 sec to all digits.  Protective sensation intact. Motor function intact.  Right foot: Incision is well coapted without any evidence of dehiscence and suture ends intact. There is no surrounding erythema, ascending cellulitis, fluctuance, crepitus, malodor, drainage/purulence. There is moderate with decrease edema on surgical site. There is mild pain along the surgical site. Mild pain with MPJ range of motion. No other areas of tenderness to bilateral lower extremities.  No other open lesions or pre-ulcerative lesions.  No pain with calf compression, swelling, warmth, erythema.   Assessment and Plan:  Status post right foot surgery, doing well with no complications   -Treatment options discussed including all alternatives, risks, and complications -Due to the swelling and suture ends intact although the incision appears to be healing well. There is decreased edema to the area. Antibiotic was applied over the incision followed by Xeroform and a dry sterile dressing.  -Continue ice and elevation -Weightbearing as tolerated in cam boot. -Refilled ibuprofen. -Follow-up in 1 week or sooner if needed. Call any questions concerns the meantime.  Ovid CurdMatthew Wagoner, DPM

## 2016-01-28 NOTE — Progress Notes (Signed)
DOS 11.15.2017 Right Foot Cutting and Repositioning of the 1st Metatarsal to Help with Big Toe Joint Pain; Use of Screw/Wire Fixations

## 2016-02-02 ENCOUNTER — Ambulatory Visit (INDEPENDENT_AMBULATORY_CARE_PROVIDER_SITE_OTHER): Payer: 59 | Admitting: Podiatry

## 2016-02-02 ENCOUNTER — Other Ambulatory Visit: Payer: Self-pay | Admitting: Podiatry

## 2016-02-02 ENCOUNTER — Ambulatory Visit (HOSPITAL_BASED_OUTPATIENT_CLINIC_OR_DEPARTMENT_OTHER)
Admission: RE | Admit: 2016-02-02 | Discharge: 2016-02-02 | Disposition: A | Discharge status: Home / Self Care | Payer: 59 | Source: Ambulatory Visit | Attending: Podiatry | Admitting: Podiatry

## 2016-02-02 ENCOUNTER — Encounter: Payer: Self-pay | Admitting: Podiatry

## 2016-02-02 DIAGNOSIS — Z09 Encounter for follow-up examination after completed treatment for conditions other than malignant neoplasm: Secondary | ICD-10-CM

## 2016-02-02 DIAGNOSIS — M205X1 Other deformities of toe(s) (acquired), right foot: Secondary | ICD-10-CM

## 2016-02-02 DIAGNOSIS — Z86718 Personal history of other venous thrombosis and embolism: Secondary | ICD-10-CM | POA: Insufficient documentation

## 2016-02-02 DIAGNOSIS — Z9889 Other specified postprocedural states: Secondary | ICD-10-CM | POA: Insufficient documentation

## 2016-02-02 DIAGNOSIS — I824Z1 Acute embolism and thrombosis of unspecified deep veins of right distal lower extremity: Secondary | ICD-10-CM

## 2016-02-02 DIAGNOSIS — M19071 Primary osteoarthritis, right ankle and foot: Secondary | ICD-10-CM | POA: Diagnosis not present

## 2016-02-02 NOTE — Progress Notes (Signed)
Subjective: Johnny Yang is a 35 y.o. is seen today in office s/p right foot Bi-plantar Austin bunionecotmy preformed on 01/13/16 for hallux limitus/HAV. He is been nonweightbearing he has not been putting any weight to his foot. He has been continuing the CAM boot at all times. He states the swelling has improved as well as the pain. He is taking ibuprofen for pain and cannot take any narcotic pain medication. He states he's had some cramping and pain to his calf since last appointment. Denies any swelling or warmth to his calf. He is also discussed a small bruise to his heel. Denies any recent injury or fall. He presents today for suture removal as well. Denies any systemic complaints such as fevers, chills, nausea, vomiting. No calf pain, chest pain, shortness of breath.   Objective: General: No acute distress, AAOx3  DP/PT pulses palpable 2/4, CRT < 3 sec to all digits.  Protective sensation intact. Motor function intact.  Right foot: Incision is well coapted without any evidence of dehiscence and suture ends intact. The edema appears to continue however it is improved compared to last appointment. There is no significant erythema is no increase in warmth. There is mild tenderness on the surgical site and there is pain or restriction with MPJ range of motion. There is no other areas of tenderness. Small minimal amount of ecchymosis present the medial heel. There is minimal tenderness with calf compression. There is no erythema, edema or increased warmth. No other open lesions or pre-ulcerative lesions.  Assessment and Plan:  Status post right foot surgery, doing well with no complications   -Treatment options discussed including all alternatives, risks, and complications -swelling continues to improve. Suture ends were removed today. Antibiotic ointment and a dressing was applied today. He can transition to weightbearing as tolerated in a cam boot. Mass with a cam boot at all times. Continue  ice and elevation as well. Given the continued swelling as well as restriction the first MPJ range of motion we will go ahead and start physical therapy to help work on this. He should remain in the cam boot for at least 6 weeks postoperatively however. -Ordered venous duplex to rule out DVT. -X-rays ordered. -Monitor for any signs or symptoms of infection and directed to call the office weekly should any occur. -Follow up in 3 weeks or sooner if needed. Call any questions or concerns meantime.  Ovid CurdMatthew Wagoner, DPM

## 2016-02-03 NOTE — Telephone Encounter (Addendum)
-----   Message from Vivi BarrackMatthew R Wagoner, DPM sent at 02/02/2016  5:03 PM EST ----- Negative for blood clot. Please let him know. 02/03/2016-Unable to leave message mail box is full. 02/04/2016-Unable to leave a message mailbox is full. 02/09/2016-Unable to leave a message the mailbox is full.

## 2016-02-04 NOTE — Telephone Encounter (Deleted)
-----   Message from Matthew R Wagoner, DPM sent at 02/02/2016  5:03 PM EST ----- Negative for blood clot. Please let him know.  

## 2016-02-09 NOTE — Telephone Encounter (Addendum)
-----   Message from Vivi BarrackMatthew R Wagoner, DPM sent at 02/02/2016  5:03 PM EST ----- Negative for blood clot. Please let him know.

## 2016-02-15 ENCOUNTER — Encounter: Payer: Self-pay | Admitting: Podiatry

## 2016-03-01 ENCOUNTER — Ambulatory Visit (HOSPITAL_BASED_OUTPATIENT_CLINIC_OR_DEPARTMENT_OTHER)
Admission: RE | Admit: 2016-03-01 | Discharge: 2016-03-01 | Disposition: A | Discharge status: Home / Self Care | Payer: 59 | Source: Ambulatory Visit | Attending: Podiatry | Admitting: Podiatry

## 2016-03-01 ENCOUNTER — Encounter: Payer: Self-pay | Admitting: Podiatry

## 2016-03-01 ENCOUNTER — Ambulatory Visit (INDEPENDENT_AMBULATORY_CARE_PROVIDER_SITE_OTHER): Payer: 59 | Admitting: Podiatry

## 2016-03-01 DIAGNOSIS — M19071 Primary osteoarthritis, right ankle and foot: Secondary | ICD-10-CM | POA: Insufficient documentation

## 2016-03-01 DIAGNOSIS — R609 Edema, unspecified: Secondary | ICD-10-CM | POA: Diagnosis not present

## 2016-03-01 DIAGNOSIS — M25474 Effusion, right foot: Secondary | ICD-10-CM | POA: Diagnosis not present

## 2016-03-01 DIAGNOSIS — Z09 Encounter for follow-up examination after completed treatment for conditions other than malignant neoplasm: Secondary | ICD-10-CM | POA: Insufficient documentation

## 2016-03-01 DIAGNOSIS — M25671 Stiffness of right ankle, not elsewhere classified: Secondary | ICD-10-CM | POA: Diagnosis not present

## 2016-03-01 DIAGNOSIS — Z9889 Other specified postprocedural states: Secondary | ICD-10-CM | POA: Diagnosis not present

## 2016-03-01 DIAGNOSIS — M21619 Bunion of unspecified foot: Secondary | ICD-10-CM

## 2016-03-01 DIAGNOSIS — M25571 Pain in right ankle and joints of right foot: Secondary | ICD-10-CM | POA: Diagnosis not present

## 2016-03-01 NOTE — Progress Notes (Signed)
Subjective: Johnny Yang is a 36 y.o. is seen today in office s/p right foot Bi-plantar Austin bunionecotmy preformed on 01/13/16 for hallux limitus/HAV. He states "it is the best it has ever looked". He has continued in the CAM Boot and he is doing well from physical therapy. He currently denies any pain. He has had 2 epsiodes of pain while he was showering and bending his foot he had a flare of the pain but other than that he is doing well.  No recent injury.  Denies any systemic complaints such as fevers, chills, nausea, vomiting. No calf pain, chest pain, shortness of breath.   Objective: General: No acute distress, AAOx3  DP/PT pulses palpable 2/4, CRT < 3 sec to all digits.  Protective sensation intact. Motor function intact.  Right foot: Incision is well coapted without any evidence of dehiscence and a scar has formed. There is still limited range of motion of the first MPJ however overall does appear to be improved. There is no pain or crepitation with range of motion of the first MPJ today. There is no tenderness to palpation of the surgical site.There is mild swelling to the area but this is greatly improved. There is no erythema or increase in warmth. There is no other areas of tenderness bilateral. There is no erythema, edema or increased warmth. No other open lesions or pre-ulcerative lesions.  Assessment and Plan:  Status post right foot surgery, doing well with no complications   -Treatment options discussed including all alternatives, risks, and complications -X-rays ordered -Compression anklet dispensed -Pending x-ray he will be able to transition out of the CAM boot and back into a regular supportive shoe.  -Continue Ice/elevation -Pain medication prn.  -Monitor for any signs or symptoms of infection and directed to call the office weekly should any occur. -Follow up in 3 weeks or sooner if needed. Call any questions or concerns meantime.  Ovid CurdMatthew Wagoner, DPM

## 2016-03-02 DIAGNOSIS — M25571 Pain in right ankle and joints of right foot: Secondary | ICD-10-CM | POA: Diagnosis not present

## 2016-03-02 DIAGNOSIS — M25671 Stiffness of right ankle, not elsewhere classified: Secondary | ICD-10-CM | POA: Diagnosis not present

## 2016-03-02 DIAGNOSIS — M25474 Effusion, right foot: Secondary | ICD-10-CM | POA: Diagnosis not present

## 2016-03-03 ENCOUNTER — Telehealth: Payer: Self-pay | Admitting: *Deleted

## 2016-03-03 NOTE — Telephone Encounter (Addendum)
-----   Message from Vivi BarrackMatthew R Wagoner, DPM sent at 03/01/2016  1:14 PM EST ----- Osteotomy appears stable- OK to start to transition to a regular shoe as tolerated. Please let him know. Follow-up as scheduled. 03/03/2016-Left message to call for result of x-rays and instructions. 03/04/2016-Pt called for x-ray results. I informed pt of Dr. Gabriel RungWagoner's review and instructed pt to wear surgical boot when out of the house and to wear regular shoe in the house until uncomfortable and then go back into the boot, may take several weeks, and can go into regular shoe out of the house once he is able to wear the regular shoe at home all day.

## 2016-03-04 DIAGNOSIS — M25474 Effusion, right foot: Secondary | ICD-10-CM | POA: Diagnosis not present

## 2016-03-04 DIAGNOSIS — M25671 Stiffness of right ankle, not elsewhere classified: Secondary | ICD-10-CM | POA: Diagnosis not present

## 2016-03-04 DIAGNOSIS — M25571 Pain in right ankle and joints of right foot: Secondary | ICD-10-CM | POA: Diagnosis not present

## 2016-03-07 ENCOUNTER — Encounter: Payer: Self-pay | Admitting: Podiatry

## 2016-03-09 DIAGNOSIS — M25474 Effusion, right foot: Secondary | ICD-10-CM | POA: Diagnosis not present

## 2016-03-09 DIAGNOSIS — M25571 Pain in right ankle and joints of right foot: Secondary | ICD-10-CM | POA: Diagnosis not present

## 2016-03-09 DIAGNOSIS — M25671 Stiffness of right ankle, not elsewhere classified: Secondary | ICD-10-CM | POA: Diagnosis not present

## 2016-03-10 DIAGNOSIS — M25671 Stiffness of right ankle, not elsewhere classified: Secondary | ICD-10-CM | POA: Diagnosis not present

## 2016-03-10 DIAGNOSIS — M25474 Effusion, right foot: Secondary | ICD-10-CM | POA: Diagnosis not present

## 2016-03-10 DIAGNOSIS — M25571 Pain in right ankle and joints of right foot: Secondary | ICD-10-CM | POA: Diagnosis not present

## 2016-03-16 DIAGNOSIS — M25571 Pain in right ankle and joints of right foot: Secondary | ICD-10-CM | POA: Diagnosis not present

## 2016-03-16 DIAGNOSIS — M25474 Effusion, right foot: Secondary | ICD-10-CM | POA: Diagnosis not present

## 2016-03-16 DIAGNOSIS — M25671 Stiffness of right ankle, not elsewhere classified: Secondary | ICD-10-CM | POA: Diagnosis not present

## 2016-03-17 DIAGNOSIS — M25671 Stiffness of right ankle, not elsewhere classified: Secondary | ICD-10-CM | POA: Diagnosis not present

## 2016-03-17 DIAGNOSIS — M25571 Pain in right ankle and joints of right foot: Secondary | ICD-10-CM | POA: Diagnosis not present

## 2016-03-17 DIAGNOSIS — M25474 Effusion, right foot: Secondary | ICD-10-CM | POA: Diagnosis not present

## 2016-03-22 ENCOUNTER — Ambulatory Visit (HOSPITAL_BASED_OUTPATIENT_CLINIC_OR_DEPARTMENT_OTHER)
Admission: RE | Admit: 2016-03-22 | Discharge: 2016-03-22 | Disposition: A | Discharge status: Home / Self Care | Payer: 59 | Source: Ambulatory Visit | Attending: Podiatry | Admitting: Podiatry

## 2016-03-22 ENCOUNTER — Ambulatory Visit (INDEPENDENT_AMBULATORY_CARE_PROVIDER_SITE_OTHER): Payer: Self-pay | Admitting: Podiatry

## 2016-03-22 DIAGNOSIS — M25571 Pain in right ankle and joints of right foot: Secondary | ICD-10-CM | POA: Diagnosis not present

## 2016-03-22 DIAGNOSIS — M19071 Primary osteoarthritis, right ankle and foot: Secondary | ICD-10-CM | POA: Insufficient documentation

## 2016-03-22 DIAGNOSIS — M205X1 Other deformities of toe(s) (acquired), right foot: Secondary | ICD-10-CM | POA: Diagnosis not present

## 2016-03-22 DIAGNOSIS — M722 Plantar fascial fibromatosis: Secondary | ICD-10-CM

## 2016-03-22 DIAGNOSIS — M25671 Stiffness of right ankle, not elsewhere classified: Secondary | ICD-10-CM | POA: Diagnosis not present

## 2016-03-22 DIAGNOSIS — Z9889 Other specified postprocedural states: Secondary | ICD-10-CM | POA: Diagnosis not present

## 2016-03-22 DIAGNOSIS — M25474 Effusion, right foot: Secondary | ICD-10-CM | POA: Diagnosis not present

## 2016-03-22 DIAGNOSIS — M79671 Pain in right foot: Secondary | ICD-10-CM | POA: Diagnosis not present

## 2016-03-22 NOTE — Progress Notes (Addendum)
Subjective: Johnny Yang is a 36 y.o. is seen today in office s/p right foot Bi-plantar Austin bunionecotmy preformed on 01/13/16 for hallux limitus/HAV. 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 He still has some pain and mild swelling to the area but he feels it is improving, just slowly. He has continued with PT. He is asking about running. No recent injury.  Denies any systemic complaints such as fevers, chills, nausea, vomiting. No calf pain, chest pain, shortness of breath.   Objective: General: No acute distress, AAOx3  DP/PT pulses palpable 2/4, CRT < 3 sec to all digits.  Protective sensation intact. Motor function intact.  Right foot: Incision is well coapted without any evidence of dehiscence and a scar has formed. There is decreased ROM of the 1st MPTJ but it is somewhat improved. There is no tenderness to palpation on the first metatarsal or the joint. There is no crepitation with first MPJ range of motion. There is no erythema but there is mild swelling. Hardware is not palpable.No other areas of tenderness bilaterally. He does get some arch pain at times, mostly with working as well but he is not experiencing this today.  There is no erythema, edema or increased warmth. No other open lesions or pre-ulcerative lesions.  Assessment and Plan:  Status post right foot surgery, doing well with no complications   -Treatment options discussed including all alternatives, risks, and complications -X-rays ordered and reviewed with the patient today.  -Recommended continue physical therapy. He can start to transition to jogging as tolerated and increase to running base on pain level. -Discussed CMO- scanned for orthotics today; will check insurance coverage.  -Recommended him to start to to transition to his work boot at home to see how he is doing with that.  -He is not taking pain medication.  -Monitor for any signs or symptoms of infection and  directed to call the office weekly should any occur. -Follow up in 3 weeks or sooner if needed. Call any questions or concerns meantime.  *Orthotics were ordered. Given hallux limitus and arch pain/plantar fasciitis symptoms I do believe he would beneifit from them.   Johnny Yang, DPM

## 2016-03-23 ENCOUNTER — Telehealth: Payer: Self-pay | Admitting: *Deleted

## 2016-03-23 DIAGNOSIS — Z9889 Other specified postprocedural states: Secondary | ICD-10-CM

## 2016-03-23 DIAGNOSIS — M21619 Bunion of unspecified foot: Secondary | ICD-10-CM

## 2016-03-23 NOTE — Telephone Encounter (Addendum)
Faxed required form, and demographics to [facility] [place].

## 2016-03-24 ENCOUNTER — Telehealth: Payer: Self-pay | Admitting: *Deleted

## 2016-03-24 DIAGNOSIS — M25571 Pain in right ankle and joints of right foot: Secondary | ICD-10-CM | POA: Diagnosis not present

## 2016-03-24 DIAGNOSIS — M25671 Stiffness of right ankle, not elsewhere classified: Secondary | ICD-10-CM | POA: Diagnosis not present

## 2016-03-24 DIAGNOSIS — M25474 Effusion, right foot: Secondary | ICD-10-CM | POA: Diagnosis not present

## 2016-03-24 NOTE — Telephone Encounter (Signed)
Called patient and left a message stating that [facility] inserts [facility] and leave the patient with 20% and I stated that I did not how much that would be and to call the office if any questions about the inserts.Misty StanleyLisa

## 2016-03-29 DIAGNOSIS — M25474 Effusion, right foot: Secondary | ICD-10-CM | POA: Diagnosis not present

## 2016-03-29 DIAGNOSIS — M25671 Stiffness of right ankle, not elsewhere classified: Secondary | ICD-10-CM | POA: Diagnosis not present

## 2016-03-29 DIAGNOSIS — M25571 Pain in right ankle and joints of right foot: Secondary | ICD-10-CM | POA: Diagnosis not present

## 2016-03-30 ENCOUNTER — Telehealth: Payer: Self-pay | Admitting: *Deleted

## 2016-03-30 NOTE — Telephone Encounter (Signed)
Called patient about the insert coverage and to call me back in the [place] office. Misty StanleyLisa

## 2016-04-12 ENCOUNTER — Ambulatory Visit: Payer: 59 | Admitting: Podiatry

## 2016-04-19 ENCOUNTER — Encounter: Payer: Self-pay | Admitting: Podiatry

## 2016-04-19 ENCOUNTER — Ambulatory Visit (INDEPENDENT_AMBULATORY_CARE_PROVIDER_SITE_OTHER): Payer: Self-pay | Admitting: Podiatry

## 2016-04-19 DIAGNOSIS — M205X1 Other deformities of toe(s) (acquired), right foot: Secondary | ICD-10-CM

## 2016-04-19 NOTE — Patient Instructions (Signed)

## 2016-04-19 NOTE — Progress Notes (Signed)
Subjective: Johnny Yang is a 36 y.o. is seen today in office s/p right foot Bi-plantar Austin bunionecotmy preformed on 01/13/16 for hallux limitus/HAV. He states that he is doing well and ready to go back to work. He presents to [facility] as well. His last appointment he has been increasing his activity and he is been going to the gym walking, running, walking the dog as well as other activities with minimal pain. Overall he states he is doing much better. He has been discharged from physical therapy as well. Denies any systemic complaints such as fevers, chills, nausea, vomiting. No calf pain, chest pain, shortness of breath.   Objective: General: No acute distress, AAOx3  DP/PT pulses palpable 2/4, CRT < 3 sec to all digits.  Protective sensation intact. Motor function intact.  Right foot: Incision is well coapted without any evidence of dehiscence and a scar has formed. There is improved ROM of the 1st MPTJ however still somewhat limited. There is no pain or crepitation with MPJ range of motion. There is no tenderness the surgical site. There is no pain with range of motion. There is no erythema, edema or increased warmth. No other open lesions or pre-ulcerative lesions.  Assessment and Plan:  Status post right foot surgery, doing well with no complications   -Treatment options discussed including all alternatives, risks, and complications -He is doing well and having no pain and there is no swelling to therefore we will hold off on any further x-rays today. -Orthotics were dispensed today. Oral and written break in instructions were discussed. If he has any issues the orthotist to call the office and I can send him back for modifications. -He can return back to work with no restrictions on Monday April 25, 2016.  -Continue to gradually increase activity level. -At this time and would discharge him from the postoperative course as he is doing well. If any time he has any issues he is  encouraged to call the office.. He agrees to this  Johnny Yang, DPM

## 2016-04-26 ENCOUNTER — Encounter: Payer: Self-pay | Admitting: Podiatry

## 2017-07-11 ENCOUNTER — Encounter: Payer: Self-pay | Admitting: Family

## 2017-07-11 ENCOUNTER — Ambulatory Visit: Payer: 59 | Admitting: Family

## 2017-07-11 VITALS — BP 124/76 | HR 80 | Temp 98.4°F | Ht 74.0 in | Wt 255.0 lb

## 2017-07-11 DIAGNOSIS — R21 Rash and other nonspecific skin eruption: Secondary | ICD-10-CM

## 2017-07-11 DIAGNOSIS — L989 Disorder of the skin and subcutaneous tissue, unspecified: Secondary | ICD-10-CM | POA: Diagnosis not present

## 2017-07-11 MED ORDER — METHYLPREDNISOLONE ACETATE 40 MG/ML IJ SUSP
40.0000 mg | Freq: Once | INTRAMUSCULAR | Status: AC
  Administered 2017-07-11: 40 mg via INTRAMUSCULAR

## 2017-07-11 MED ORDER — PREDNISONE 20 MG PO TABS: 40.0000 mg | ORAL_TABLET | Freq: Every day | ORAL | 0 refills | Status: DC

## 2017-07-11 NOTE — Progress Notes (Signed)
Johnny Yang is a 37 y.o. male with the following history as recorded in Epic[facility]:  Patient Active Problem List   Diagnosis Date Noted  . 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 Testicular pain 05/28/2015  . ANXIETY STATE, UNSPECIFIED 10/31/2008  . ALLERGIC RHINITIS 10/31/2008  . Migraine equivalent 10/31/2008    Current Outpatient Medications  Medication Sig Dispense Refill  . cetirizine (ZYRTEC) 10 MG tablet Take 10 mg by mouth daily.    . predniSONE (DELTASONE) 20 MG tablet Take 2 tablets (40 mg total) by mouth daily with breakfast. 10 tablet 0   Current Facility-Administered Medications  Medication Dose Route Frequency Provider Last Rate Last Dose  . methylPREDNISolone acetate (DEPO-MEDROL) injection 40 mg  40 mg Intramuscular Once Olive Bass, FNP        Allergies: Patient has no known allergies.  Past Medical History:  Diagnosis Date  . Allergy    seasonal    Past Surgical History:  Procedure Laterality Date  . TONSILLECTOMY AND ADENOIDECTOMY      Family History  Problem Relation Age of Onset  . Cancer Paternal Aunt        ovarian    Social History   Tobacco Use  . Smoking status: Current Every Day Smoker  . Smokeless tobacco: Never Used  . Tobacco comment: 1 cigar / day  Substance Use Topics  . 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    Objective:  Vitals:   07/11/17 1023  BP: 124/76  Pulse: 80  Temp: 98.4 F (36.9 C)  TempSrc: Oral  SpO2: 97%   Weight: 255 lb (115.7 kg)  Height:  (1.88 m)    General: Well developed, well nourished, in no acute distress  Skin : Warm and dry. Eczema type lesions noted over back, around neck and on both arms;  Head: Normocephalic and atraumatic  Lungs: Respirations unlabored; clear to auscultation bilaterally without wheeze, rales, rhonchi  Neurologic: Alert and oriented; speech intact; face symmetrical; moves all extremities well; CNII-XII intact without focal deficit  Assessment:  1. Rash   2. Changing skin lesion     Plan:  1 Depo-Medrol IM 40 mg given in office; Rx for Prednisone 40 mg x 5 days; encouraged to continue using Zyrtec; may need to refer back to allergist. 2. Refer to dermatology per patient request.   Encouraged to follow-up for CPE;   Return in about 2 weeks (around 07/25/2017) for CPE.  Orders Placed This Encounter  Procedures  . Ambulatory referral to Dermatology    Referral Priority:   Routine    Referral Type:   Consultation    Referral Reason:   Specialty Services Required    Requested Specialty:   Dermatology    Number of Visits Requested:   1    Requested Prescriptions   Signed Prescriptions Disp Refills  . predniSONE (DELTASONE) 20 MG tablet 10 tablet 0    Sig: Take 2 tablets (40 mg total) by mouth  daily with breakfast.

## 2017-07-25 ENCOUNTER — Encounter: Payer: 59 | Admitting: Family

## 2017-07-26 ENCOUNTER — Ambulatory Visit: Payer: 59 | Admitting: Family

## 2017-07-26 ENCOUNTER — Encounter: Payer: Self-pay | Admitting: Family

## 2017-07-26 ENCOUNTER — Other Ambulatory Visit (INDEPENDENT_AMBULATORY_CARE_PROVIDER_SITE_OTHER): Payer: 59

## 2017-07-26 VITALS — BP 126/72 | HR 96 | Temp 98.3°F | Ht 74.0 in | Wt 254.1 lb

## 2017-07-26 DIAGNOSIS — Z0000 Encounter for general adult medical examination without abnormal findings: Secondary | ICD-10-CM

## 2017-07-26 DIAGNOSIS — Z23 Encounter for immunization: Secondary | ICD-10-CM

## 2017-07-26 DIAGNOSIS — Z114 Encounter for screening for human immunodeficiency virus [HIV]: Secondary | ICD-10-CM

## 2017-07-26 DIAGNOSIS — Z13220 Encounter for screening for lipoid disorders: Secondary | ICD-10-CM | POA: Diagnosis not present

## 2017-07-26 DIAGNOSIS — L509 Urticaria, unspecified: Secondary | ICD-10-CM | POA: Diagnosis not present

## 2017-07-26 LAB — LIPID PANEL
CHOL/HDL RATIO: 4
CHOLESTEROL: 171 mg/dL (ref 0–200)
HDL: 42.2 mg/dL (ref >39.00)
NONHDL: 129.16
Triglycerides: 288 mg/dL — ABNORMAL HIGH (ref 0.0–149.0)
VLDL: 57.6 mg/dL — AB (ref 0.0–40.0)

## 2017-07-26 LAB — CBC WITH DIFFERENTIAL/PLATELET
BASOS ABS: 0.1 10*3/uL (ref 0.0–0.1)
BASOS PCT: 1 % (ref 0.0–3.0)
EOS ABS: 0.1 10*3/uL (ref 0.0–0.7)
Eosinophils Relative: 0.8 % (ref 0.0–5.0)
HEMATOCRIT: 45 % (ref 39.0–52.0)
Hemoglobin: 15.4 g/dL (ref 13.0–17.0)
LYMPHS ABS: 2.7 10*3/uL (ref 0.7–4.0)
Lymphocytes Relative: 28.1 % (ref 12.0–46.0)
MCHC: 34.2 g/dL (ref 30.0–36.0)
MCV: 92.8 fl (ref 78.0–100.0)
MONO ABS: 0.9 10*3/uL (ref 0.1–1.0)
Monocytes Relative: 9.8 % (ref 3.0–12.0)
NEUTROS ABS: 5.8 10*3/uL (ref 1.4–7.7)
NEUTROS PCT: 60.3 % (ref 43.0–77.0)
PLATELETS: 293 10*3/uL (ref 150.0–400.0)
RBC: 4.85 Mil/uL (ref 4.22–5.81)
RDW: 12.8 % (ref 11.5–15.5)
WBC: 9.6 10*3/uL (ref 4.0–10.5)

## 2017-07-26 LAB — COMPREHENSIVE METABOLIC PANEL
ALT: 36 U/L (ref 0–53)
AST: 30 U/L (ref 0–37)
Albumin: 4.5 g/dL (ref 3.5–5.2)
Alkaline Phosphatase: 60 U/L (ref 39–117)
BILIRUBIN TOTAL: 0.4 mg/dL (ref 0.2–1.2)
BUN: 20 mg/dL (ref 6–23)
CHLORIDE: 105 meq/L (ref 96–112)
CO2: 27 meq/L (ref 19–32)
CREATININE: 1.51 mg/dL — AB (ref 0.40–1.50)
Calcium: 9.8 mg/dL (ref 8.4–10.5)
GFR: 68.09 mL/min (ref >60.00)
GLUCOSE: 89 mg/dL (ref 70–99)
Potassium: 4.4 mEq/L (ref 3.5–5.1)
SODIUM: 140 meq/L (ref 135–145)
Total Protein: 7.7 g/dL (ref 6.0–8.3)

## 2017-07-26 LAB — LDL CHOLESTEROL, DIRECT: Direct LDL: 103 mg/dL

## 2017-07-26 MED ORDER — PREDNISONE 10 MG (21) PO TBPK: ORAL_TABLET | ORAL | 0 refills | Status: DC

## 2017-07-26 MED ORDER — RANITIDINE HCL 150 MG PO TABS: 150.0000 mg | ORAL_TABLET | Freq: Two times a day (BID) | ORAL | 1 refills | Status: DC

## 2017-07-26 NOTE — Progress Notes (Signed)
Johnny Yang is a 37 y.o. male with the following history as recorded in [facility]:  Patient Active Problem List   Diagnosis Date Noted  . 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 Testicular pain 05/28/2015  . ANXIETY STATE, UNSPECIFIED 10/31/2008  . ALLERGIC RHINITIS 10/31/2008  . Migraine equivalent 10/31/2008    Current Outpatient Medications  Medication Sig Dispense Refill  . cetirizine (ZYRTEC) 10 MG tablet Take 10 mg by mouth daily.    . predniSONE (STERAPRED UNI-PAK 21 TAB) 10 MG (21) TBPK tablet Taper as directed 21 tablet 0  . ranitidine (ZANTAC) 150 MG tablet Take 1 tablet (150 mg total) by mouth 2 (two) times daily. 60 tablet 1   No current facility-administered medications for this visit.     Allergies: Patient has no known allergies.  Past Medical History:  Diagnosis Date  . Allergy    seasonal    Past Surgical History:  Procedure Laterality Date  . TONSILLECTOMY AND ADENOIDECTOMY      Family History  Problem Relation Age of Onset  . Cancer Paternal Aunt        ovarian    Social History   Tobacco Use  . Smoking status: Current Every Day Smoker  . Smokeless tobacco: Never Used  . Tobacco comment: 1 cigar / day  Substance Use Topics  . Alcohol use: Yes    Comment:  occasionally as 2-3 beverages 2-3 X/ week    Subjective:  Patient presents for yearly CPE; continuing to have problems with hives- responded to Prednisone 2 weeks ago but notes that symptoms returned about 3 days ago; does work as a Development worker, international aid also- thinks this may be the source of the symptoms; already taking Zyrtec bid; no new foods, detergents, soaps, medications.   Tdap- will update today; Needs to get back to his dentist and eye doctor and will schedule follow-up.  Objective:  Vitals:   07/26/17 1330  BP: 126/72  Pulse: 96  Temp: 98.3 F (36.8 C)  TempSrc: Oral  SpO2: 96%  Weight: 254 lb 1.3 oz (115.2 kg)  Height: '6\' 2"'$  (1.88 m)    General: Well developed, well  nourished, in no acute distress  Skin : Warm and dry. Hives, rash noted around neck/ upper extremities Head: Normocephalic and atraumatic  Eyes: Sclera and conjunctiva clear; pupils round and reactive to light; extraocular movements intact  Ears: External normal; canals clear; tympanic membranes normal  Oropharynx: Pink, supple. No suspicious lesions  Neck: Supple without thyromegaly, adenopathy  Lungs: Respirations unlabored; clear to auscultation bilaterally without wheeze, rales, rhonchi  CVS exam: normal rate and regular rhythm.  Neurologic: Alert and oriented; speech intact; face symmetrical; moves all extremities well; CNII-XII intact without focal deficit   Assessment:  1. PE (physical exam), annual   2. Hives   3. Lipid screening   4. Encounter for screening for HIV     Plan:  Age appropriate preventive healthcare needs reviewed; encouraged dental exam, need to quit smoking; labs updated; Tdap updated; refer to allergist due to recurrent episodes of hives- need to determine allergic trigger; For current hives, Prednisone taper pak, Zantac 150 mg bid and Zyrtec 10 mg; follow-up as needed.  No follow-ups on file.  Orders Placed This Encounter  Procedures  . Tdap vaccine greater than or equal to 7yo IM  . CBC w/Diff    Standing Status:   Future    Number of Occurrences:   1    Standing Expiration Date:  07/26/2018  . Comp Met (CMET)    Standing Status:   Future    Number of Occurrences:   1    Standing Expiration Date:   07/26/2018  . Lipid panel    Standing Status:   Future    Number of Occurrences:   1    Standing Expiration Date:   07/27/2018  . HIV antibody    Standing Status:   Future    Number of Occurrences:   1    Standing Expiration Date:   07/27/2018  . Ambulatory referral to Allergy    Referral Priority:   Routine    Referral Type:   Allergy Testing    Referral Reason:   Specialty Services Required    Requested Specialty:   Allergy    Number of Visits  Requested:   1    Requested Prescriptions   Signed Prescriptions Disp Refills  . predniSONE (STERAPRED UNI-PAK 21 TAB) 10 MG (21) TBPK tablet 21 tablet 0    Sig: Taper as directed  . ranitidine (ZANTAC) 150 MG tablet 60 tablet 1    Sig: Take 1 tablet (150 mg total) by mouth 2 (two) times daily.

## 2017-07-26 NOTE — Patient Instructions (Signed)
Please pick up OTC Debrox for your ears;

## 2017-07-27 LAB — HIV ANTIBODY (ROUTINE TESTING W REFLEX): HIV: NONREACTIVE

## 2017-07-28 ENCOUNTER — Encounter: Payer: Self-pay | Admitting: Family

## 2017-08-07 ENCOUNTER — Other Ambulatory Visit: Payer: Self-pay | Admitting: Family

## 2017-08-07 ENCOUNTER — Encounter: Payer: Self-pay | Admitting: Family

## 2017-08-07 ENCOUNTER — Telehealth: Payer: Self-pay | Admitting: Family

## 2017-08-07 MED ORDER — MOMETASONE FUROATE 0.1 % EX CREA: 1.0000 "application " | TOPICAL_CREAM | Freq: Every day | CUTANEOUS | 0 refills | Status: DC

## 2017-08-07 NOTE — Telephone Encounter (Signed)
Copied from CRM #[id number]. Topic: Inquiry °>> Aug 07, 2017  1:31 PM Kylar Leonhardt L, NT wrote: °Reason for CRM: Patient calling back in reference to e-mail sent this morning and is following up and would like to be  contacted at [phone] he was seen by Laura Murray on 07/26/17 at [facility] .  °  ° °

## 2017-08-07 NOTE — Telephone Encounter (Signed)
Copied from CRM [id number]. Topic: Inquiry >> Aug 07, 2017  1:31 PM Stephannie LiSimmons, Janett L, NT wrote: Reason for CRM: Patient calling back in reference to e-mail sent this morning and is following up and would like to be  contacted at [phone] he [facility]

## 2017-08-07 NOTE — Telephone Encounter (Signed)
Spoke with patient. He would like for you to go ahead and call him in the steroid cream so he can pick it up today.  Thanks

## 2017-08-07 NOTE — Telephone Encounter (Signed)
Please let him know that in general, we don't want to keep him on oral steroids. I can call in a low dose topical steroid cream for him to use sparingly. Make sure he is taking the anti-histamines we discussed; please keep the appointment with the allergist for June 21.

## 2017-08-18 ENCOUNTER — Encounter: Payer: Self-pay | Admitting: Allergy and Immunology

## 2017-08-18 ENCOUNTER — Ambulatory Visit: Payer: 59 | Admitting: Allergy and Immunology

## 2017-08-18 VITALS — BP 110/86 | HR 95 | Temp 97.7°F | Resp 16 | Ht 74.0 in | Wt 246.9 lb

## 2017-08-18 DIAGNOSIS — H1013 Acute atopic conjunctivitis, bilateral: Secondary | ICD-10-CM | POA: Diagnosis not present

## 2017-08-18 DIAGNOSIS — H1010 Acute atopic conjunctivitis, unspecified eye: Secondary | ICD-10-CM | POA: Insufficient documentation

## 2017-08-18 DIAGNOSIS — L500 Allergic urticaria: Secondary | ICD-10-CM | POA: Insufficient documentation

## 2017-08-18 DIAGNOSIS — J3089 Other allergic rhinitis: Secondary | ICD-10-CM | POA: Diagnosis not present

## 2017-08-18 MED ORDER — FLUTICASONE PROPIONATE 50 MCG/ACT NA SUSP: NASAL | 5 refills | Status: DC

## 2017-08-18 MED ORDER — OLOPATADINE HCL 0.2 % OP SOLN: OPHTHALMIC | 5 refills | Status: DC

## 2017-08-18 MED ORDER — MONTELUKAST SODIUM 10 MG PO TABS: ORAL_TABLET | ORAL | 5 refills | Status: DC

## 2017-08-18 MED ORDER — LEVOCETIRIZINE DIHYDROCHLORIDE 5 MG PO TABS: ORAL_TABLET | ORAL | 5 refills | Status: DC

## 2017-08-18 NOTE — Assessment & Plan Note (Signed)
   Treatment plan as outlined above for allergic rhinitis.  A prescription has been provided for Pataday, one drop per eye daily as needed.  I have also recommended eye lubricant drops (i.e., Natural Tears) as needed. 

## 2017-08-18 NOTE — Assessment & Plan Note (Signed)
   Aeroallergen avoidance measures have been discussed and provided in written form.  Levocetirizine and montelukast have been prescribed (as above).  A prescription has been provided for fluticasone nasal spray, one spray per nostril 1-2 times daily as needed. Proper nasal spray technique has been discussed and demonstrated.  Nasal saline spray (i.e., Simply Saline) or nasal saline lavage (i.e., NeilMed) is recommended as needed and prior to medicated nasal sprays.  If allergen avoidance measures and medications fail to adequately relieve symptoms, aeroallergen immunotherapy will be considered. 

## 2017-08-18 NOTE — Patient Instructions (Addendum)
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 Skin tests to select food allergens were negative today. NSAIDs and emotional stress commonly exacerbate urticaria but are not the underlying etiology in this case. Physical urticarias are negative by history (i.e. pressure-induced, temperature, vibration, solar, etc.). History and lesions are not consistent with urticaria pigmentosa so I am not suspicious for mastocytosis. There are no concomitant symptoms concerning for anaphylaxis or constitutional symptoms worrisome for an underlying malignancy.   We will not order labs at this time, however, if lesions recur, persist, progress, or change in character in the absence of pollen exposure, we will assess potential etiologies with screening labs.  For symptom relief, patient is to take oral antihistamines as directed.  A prescription has been provided for levocetirizine 5 mg daily as needed.  A prescription has been provided for montelukast 10 mg daily at bedtime.  Should symptoms recur in the absence of pollen exposure, a journal is to be kept recording any foods eaten, beverages consumed, medications taken within a 6 hour period prior to the onset of symptoms, as well as record activities being performed, and environmental conditions. For any symptoms concerning for anaphylaxis, 911 is to be called immediately.  Seasonal and perennial allergic rhinitis  Aeroallergen avoidance measures have been discussed and provided in written form.  Levocetirizine and montelukast have been prescribed (as above).  A prescription has been provided for fluticasone nasal spray, one spray per nostril 1-2 times daily as needed. Proper nasal spray technique has been discussed and demonstrated.  Nasal saline spray (i.e., Simply Saline) or nasal saline lavage (i.e., NeilMed) is recommended as needed and prior to medicated nasal sprays.  If  allergen avoidance measures and medications fail to adequately relieve symptoms, aeroallergen immunotherapy will be considered.  Allergic conjunctivitis  Treatment plan as outlined above for allergic rhinitis.  A prescription has been provided for Pataday, one drop per eye daily as needed.  I have also recommended eye lubricant drops (i.e., Natural Tears) as needed.   Return in about 4 months (around 12/18/2017), or if symptoms worsen or fail to improve.  Urticaria (Hives)  . Levocetirizine (Xyzal) 5 mg twice a day and ranitidine (Zantac) 150 mg twice a day. If no symptoms for 7-14 days then decrease to. . Levocetirizine (Xyzal) 5 mg twice a day and ranitidine (Zantac) 150 mg once a day.  If no symptoms for 7-14 days then decrease to. . Levocetirizine (Xyzal) 5 mg twice a day.  If no symptoms for 7-14 days then decrease to. . Levocetirizine (Xyzal) 5 mg once a day.  May use Benadryl (diphenhydramine) as needed for breakthrough symptoms       If symptoms return, then step up dosage  Reducing Pollen Exposure  The American Academy of Allergy, Asthma and Immunology suggests the following steps to reduce your exposure to pollen during allergy seasons.    1. Do not hang sheets or clothing out to dry; pollen may collect on these items. 2. Do not mow lawns or spend time around freshly cut grass; mowing stirs up pollen. 3. Keep windows closed at night.  Keep car windows closed while driving. 4. Minimize morning activities outdoors, a time when pollen counts are usually at their highest. 5. Stay indoors as much as possible when pollen counts or humidity is high and on windy days when pollen tends to remain in the air longer. 6. Use air conditioning when possible.  Many air conditioners have filters  that trap the pollen spores. 7. Use a HEPA room air filter to remove pollen form the indoor air you breathe.   Control of House Dust Mite Allergen  House dust mites play a major role in  allergic asthma and rhinitis.  They occur in environments with high humidity wherever human skin, the food for dust mites is found. High levels have been detected in dust obtained from mattresses, pillows, carpets, upholstered furniture, bed covers, clothes and soft toys.  The principal allergen of the house dust mite is found in its feces.  A gram of dust may contain 1,000 mites and 250,000 fecal particles.  Mite antigen is easily measured in the air during house cleaning activities.    1. Encase mattresses, including the box spring, and pillow, in an air tight cover.  Seal the zipper end of the encased mattresses with wide adhesive tape. 2. Wash the bedding in water of 130 degrees Farenheit weekly.  Avoid cotton comforters/quilts and flannel bedding: the most ideal bed covering is the dacron comforter. 3. Remove all upholstered furniture from the bedroom. 4. Remove carpets, carpet padding, rugs, and non-washable window drapes from the bedroom.  Wash drapes weekly or use plastic window coverings. 5. Remove all non-washable stuffed toys from the bedroom.  Wash stuffed toys weekly. 6. Have the room cleaned frequently with a vacuum cleaner and a damp dust-mop.  The patient should not be in a room which is being cleaned and should wait 1 hour after cleaning before going into the room. 7. Close and seal all heating outlets in the bedroom.  Otherwise, the room will become filled with dust-laden air.  An electric heater can be used to heat the room. Reduce indoor humidity to less than 50%.  Do not use a humidifier.  Control of Dog or Cat Allergen  Avoidance is the best way to manage a dog or cat allergy. If you have a dog or cat and are allergic to dog or cats, consider removing the dog or cat from the home. If you have a dog or cat but don't want to find it a new home, or if your family wants a pet even though someone in the household is allergic, here are some strategies that may help keep symptoms at  bay:  1. Keep the pet out of your bedroom and restrict it to only a few rooms. Be advised that keeping the dog or cat in only one room will not limit the allergens to that room. 2. Don't pet, hug or kiss the dog or cat; if you do, wash your hands with soap and water. 3. High-efficiency particulate air (HEPA) cleaners run continuously in a bedroom or living room can reduce allergen levels over time. 4. Place electrostatic material sheet in the air inlet vent in the bedroom. 5. Regular use of a high-efficiency vacuum cleaner or a central vacuum can reduce allergen levels. 6. Giving your dog or cat a bath at least once a week can reduce airborne allergen.  Control of Mold Allergen  Mold and fungi can grow on a variety of surfaces provided certain temperature and moisture conditions exist.  Outdoor molds grow on plants, decaying vegetation and soil.  The major outdoor mold, Alternaria and Cladosporium, are found in very high numbers during hot and dry conditions.  Generally, a late Summer - Fall peak is seen for common outdoor fungal spores.  Rain will temporarily lower outdoor mold spore count, but counts rise rapidly when the rainy period ends.  The most important indoor molds are Aspergillus and Penicillium.  Dark, humid and poorly ventilated basements are ideal sites for mold growth.  The next most common sites of mold growth are the bathroom and the kitchen.  Outdoor [employer] 5. Use air conditioning and keep windows closed 6. Avoid exposure to decaying vegetation. 7. Avoid leaf raking. 8. Avoid grain handling. 9. Consider wearing a face mask if working in moldy areas.  Indoor Mold Control 1. Maintain humidity below 50%. 2. Clean washable surfaces with 5% bleach solution. 3. Remove sources e.g. Contaminated carpets.

## 2017-08-18 NOTE — Assessment & Plan Note (Addendum)
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 Skin tests to select food allergens were negative today. NSAIDs and emotional stress commonly exacerbate urticaria but are not the underlying etiology in this case. Physical urticarias are negative by history (i.e. pressure-induced, temperature, vibration, solar, etc.). History and lesions are not consistent with urticaria pigmentosa so I am not suspicious for mastocytosis. There are no concomitant symptoms concerning for anaphylaxis or constitutional symptoms worrisome for an underlying malignancy.   We will not order labs at this time, however, if lesions recur, persist, progress, or change in character in the absence of pollen exposure, we will assess potential etiologies with screening labs.  For symptom relief, patient is to take oral antihistamines as directed.  A prescription has been provided for levocetirizine 5 mg daily as needed.  A prescription has been provided for montelukast 10 mg daily at bedtime.  Should symptoms recur in the absence of pollen exposure, a journal is to be kept recording any foods eaten, beverages consumed, medications taken within a 6 hour period prior to the onset of symptoms, as well as record activities being performed, and environmental conditions. For any symptoms concerning for anaphylaxis, 911 is to be called immediately.

## 2017-08-18 NOTE — Progress Notes (Signed)
New Patient Note  RE: Johnny Yang MRN: [id number] DOB: 04/18/1980 Date of Office Visit: 08/18/2017  Referring provider: Olive Bass,* Primary care provider: Patient, No Pcp Per  Chief Complaint: Urticaria and Allergic Rhinitis    History of present illness: Johnny Yang is a 37 y.o. male seen today in consultation requested by Olive Bass, NP.  Approximately 4 weeks ago, Johnny Yang experienced recurrent episodes of hives. The distribution of hives included the face, neck, chest, back and arms.  The lesions are described as erythematous, raised, and pruritic.  Individual hives lasted less than 24 hours without leaving residual pigmentation or bruising. He denied concomitant angioedema, cardiopulmonary symptoms and GI symptoms.  He has not experienced unexpected weight loss, recurrent fevers or drenching night sweats. No specific medication, food, skin care product, detergent, soap, or other environmental triggers have been identified. The symptoms did not seem to correlate with NSAIDs use or emotional stress. He did not have symptoms consistent with a respiratory tract infection at the time of symptom onset. Johnny Yang tried to control symptoms with systemic steroids and OTC antihistamines which offered adequate temporary relief. He has not been evaluated and treated in the emergency department for these symptoms. Skin biopsy has not been performed. The hives resolved 2 weeks ago without recurrence. Johnny Yang experiences nasal congestion, rhinorrhea, sneezing, postnasal drainage, nasal pruritus, ocular pruritus, and occasional sinus pressure.  The symptoms are most frequent and severe during summertime.  He attempts to control these symptoms with cetirizine as needed.  Assessment and plan: Allergic urticaria The patient's history and skin test results suggest allergic urticaria secondary to grass pollen exposure. Skin tests to select food allergens were negative today. NSAIDs  and emotional stress commonly exacerbate urticaria but are not the underlying etiology in this case. Physical urticarias are negative by history (i.e. pressure-induced, temperature, vibration, solar, etc.). History and lesions are not consistent with urticaria pigmentosa so I am not suspicious for mastocytosis. There are no concomitant symptoms concerning for anaphylaxis or constitutional symptoms worrisome for an underlying malignancy.   We will not order labs at this time, however, if lesions recur, persist, progress, or change in character in the absence of pollen exposure, we will assess potential etiologies with screening labs.  For symptom relief, patient is to take oral antihistamines as directed.  A prescription has been provided for levocetirizine 5 mg daily as needed.  A prescription has been provided for montelukast 10 mg daily at bedtime.  Should symptoms recur in the absence of pollen exposure, a journal is to be kept recording any foods eaten, beverages consumed, medications taken within a 6 hour period prior to the onset of symptoms, as well as record activities being performed, and environmental conditions. For any symptoms concerning for anaphylaxis, 911 is to be called immediately.  Seasonal and perennial allergic rhinitis  Aeroallergen avoidance measures have been discussed and provided in written form.  Levocetirizine and montelukast have been prescribed (as above).  A prescription has been provided for fluticasone nasal spray, one spray per nostril 1-2 times daily as needed. Proper nasal spray technique has been discussed and demonstrated.  Nasal saline spray (i.e., Simply Saline) or nasal saline lavage (i.e., NeilMed) is recommended as needed and prior to medicated nasal sprays.  If allergen avoidance measures and medications fail to adequately relieve symptoms, aeroallergen immunotherapy will be considered.  Allergic conjunctivitis  Treatment plan as outlined above for  allergic rhinitis.  A prescription has been provided for Pataday, one drop per  eye daily as needed.  I have also recommended eye lubricant drops (i.e., Natural Tears) as needed.   Meds ordered this encounter  Medications  . levocetirizine (XYZAL) 5 MG tablet    Sig: 1 tablet once a day if needed for runny nose or itching.    Dispense:  34 tablet    Refill:  5  . montelukast (SINGULAIR) 10 MG tablet    Sig: 1 tablet once at night for coughing or wheezing.    Dispense:  34 tablet    Refill:  5  . fluticasone (FLONASE) 50 MCG/ACT nasal spray    Sig: One spray per nostril 1-2 times daily as needed for stuffy nose    Dispense:  16 g    Refill:  5  . Olopatadine HCl (PATADAY) 0.2 % SOLN    Sig: 1 drop each eye daily if needed for itchy eyes.    Dispense:  1 Bottle    Refill:  5    Diagnostics: Epicutaneous testing: Robust reactivity to grass pollen and dust mite antigen.  Reactive to molds, cat dander, and dog epithelia.   Intradermal testing: Reactive to molds, tree pollen, ragweed pollen, and weed pollen. Food allergen skin testing: Negative despite a positive histamine control.    Physical examination: Blood pressure 110/86, pulse 95, temperature 97.7 F (36.5 C), temperature source Oral, resp. rate 16, height 6\' 2"  (1.88 m), weight 246 lb 14.6 oz (112 kg), SpO2 97 %.  General: Alert, interactive, in no acute distress. HEENT: TMs pearly gray, turbinates moderately edematous with clear discharge, post-pharynx moderately erythematous. Neck: Supple without lymphadenopathy. Lungs: Clear to auscultation without wheezing, rhonchi or rales. CV: Normal S1, S2 without murmurs. Abdomen: Nondistended, nontender. Skin: Warm and dry, without lesions or rashes. Extremities:  No clubbing, cyanosis or edema. Neuro:   Grossly intact.  Review of systems:  Review of systems negative except as noted in HPI / PMHx or noted below: Review of Systems  Constitutional: Negative.   HENT:  Negative.   Eyes: Negative.   Respiratory: Negative.   Cardiovascular: Negative.   Gastrointestinal: Negative.   Genitourinary: Negative.   Musculoskeletal: Negative.   Skin: Negative.   Neurological: Negative.   Endo/Heme/Allergies: Negative.   Psychiatric/Behavioral: Negative.     Past medical history:  Past Medical History:  Diagnosis Date  . Allergy    seasonal  . Urticaria     Past surgical history:  Past Surgical History:  Procedure Laterality Date  . FOOT SURGERY Left   . TONSILLECTOMY AND ADENOIDECTOMY      Family history: Family History  Problem Relation Age of Onset  . Cancer Paternal Aunt        ovarian  . Allergic rhinitis Mother   . Allergic rhinitis Father   . Asthma Neg Hx   . Angioedema Neg Hx   . Eczema Neg Hx   . Immunodeficiency Neg Hx   . Urticaria Neg Hx     Social history: Social History   Socioeconomic History  . Marital status: Single    Spouse name: Not on file  . Number of children: Not on file  . Years of education: Not on file  . Highest education level: Not on file  Occupational History  . Not on file  Social Needs  . Financial resource strain: Not on file  . Food insecurity:    Worry: Not on file    Inability: Not on file  . Transportation needs:    Medical: Not on file  Non-medical: Not on file  Tobacco Use  . Smoking status: Current Every Day Smoker    Types: Cigars  . Smokeless tobacco: Never Used  . Tobacco comment: 1 cigar / day  Substance and Sexual Activity  . Alcohol use: Yes    Comment:  occasionally as 2-3 beverages 2-3 X/ week  . Drug use: No  . Sexual activity: Not on file  Lifestyle  . Physical activity:    Days per week: Not on file    Minutes per session: Not on file  . Stress: Not on file  Relationships  . Social connections:    Talks on phone: Not on file    Gets together: Not on file    Attends religious service: Not on file    Active member of club or organization: Not on file     Attends meetings of clubs or organizations: Not on file    Relationship status: Not on file  . Intimate partner violence:    Fear of current or ex partner: Not on file    Emotionally abused: Not on file    Physically abused: Not on file    Forced sexual activity: Not on file  Other Topics Concern  . Not on file  Social History Narrative  . Not on file   Environmental History: The patient lives in a 35 year old house with hardwood floors throughout and central air/heat.  There is mold/water damage in the home.  There are no pets in the home.  He is a former cigarette smoker having quit in 2004.  Allergies as of 08/18/2017   No Known Allergies     Medication List        Accurate as of 08/18/17  2:17 PM. Always use your most recent med list.          cetirizine 10 MG tablet Commonly known as:  ZYRTEC Take 10 mg by mouth daily.   fluticasone 50 MCG/ACT nasal spray Commonly known as:  FLONASE One spray per nostril 1-2 times daily as needed for stuffy nose   levocetirizine 5 MG tablet Commonly known as:  XYZAL 1 tablet once a day if needed for runny nose or itching.   mometasone 0.1 % cream Commonly known as:  ELOCON Apply 1 application topically daily.   montelukast 10 MG tablet Commonly known as:  SINGULAIR 1 tablet once at night for coughing or wheezing.   Olopatadine HCl 0.2 % Soln Commonly known as:  PATADAY 1 drop each eye daily if needed for itchy eyes.   ranitidine 150 MG tablet Commonly known as:  ZANTAC Take 1 tablet (150 mg total) by mouth 2 (two) times daily.       Known medication allergies: No Known Allergies  I appreciate the opportunity to take part in Johnny Yang's care. Please do not hesitate to contact me with questions.  Sincerely,   R. Jorene Guestarter Canna Nickelson, MD

## 2017-08-20 IMAGING — DX DG FOOT COMPLETE 3+V*R*
3 series · 3 of 3 positions shown · non-contrast
Comparison: None.

CLINICAL DATA: Status post surgery.

EXAM:
RIGHT FOOT COMPLETE - 3+ VIEW

[foot ap]
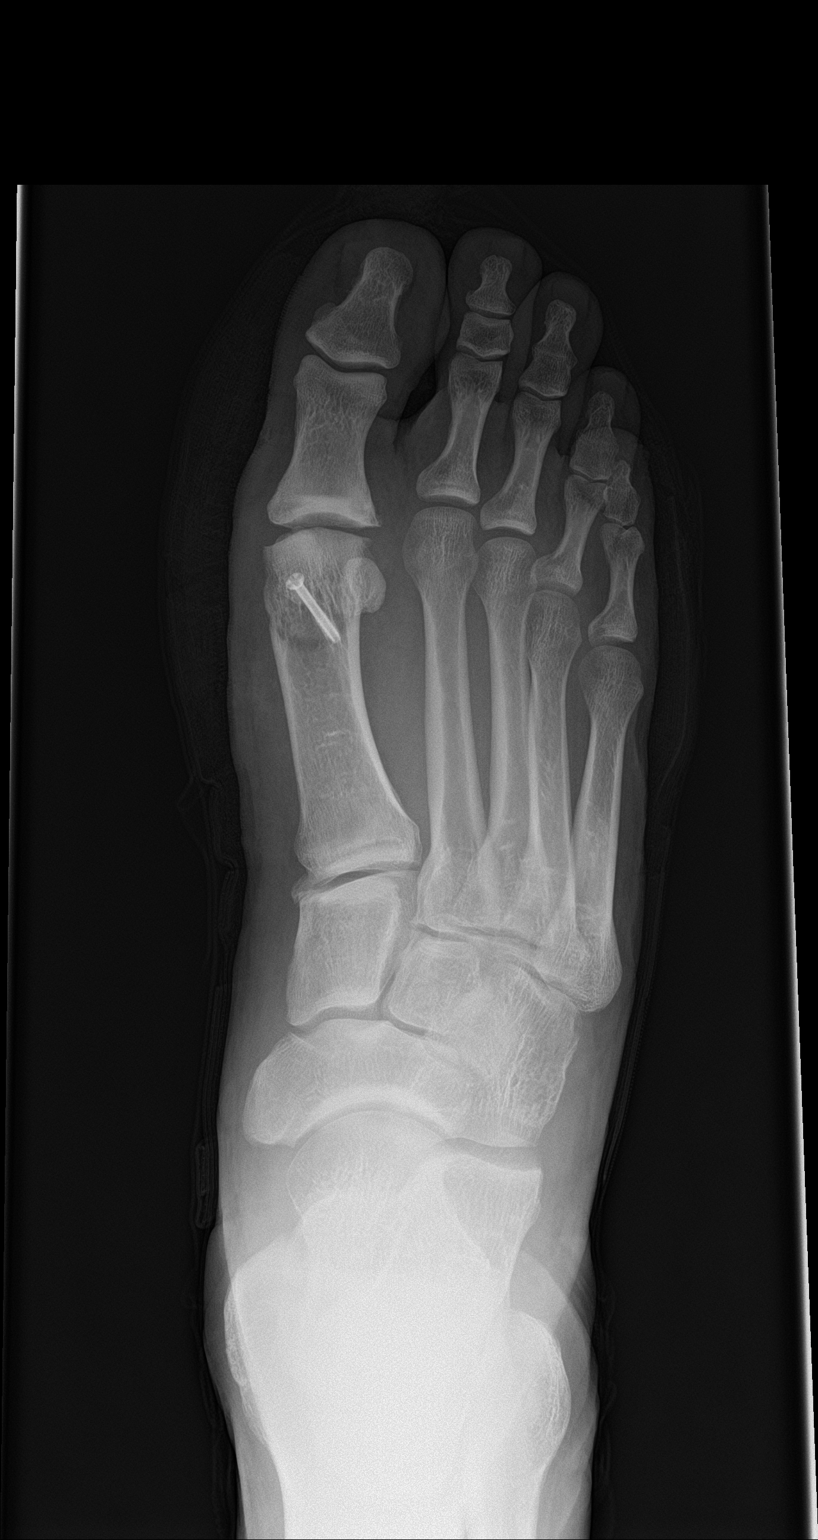

[foot obl]
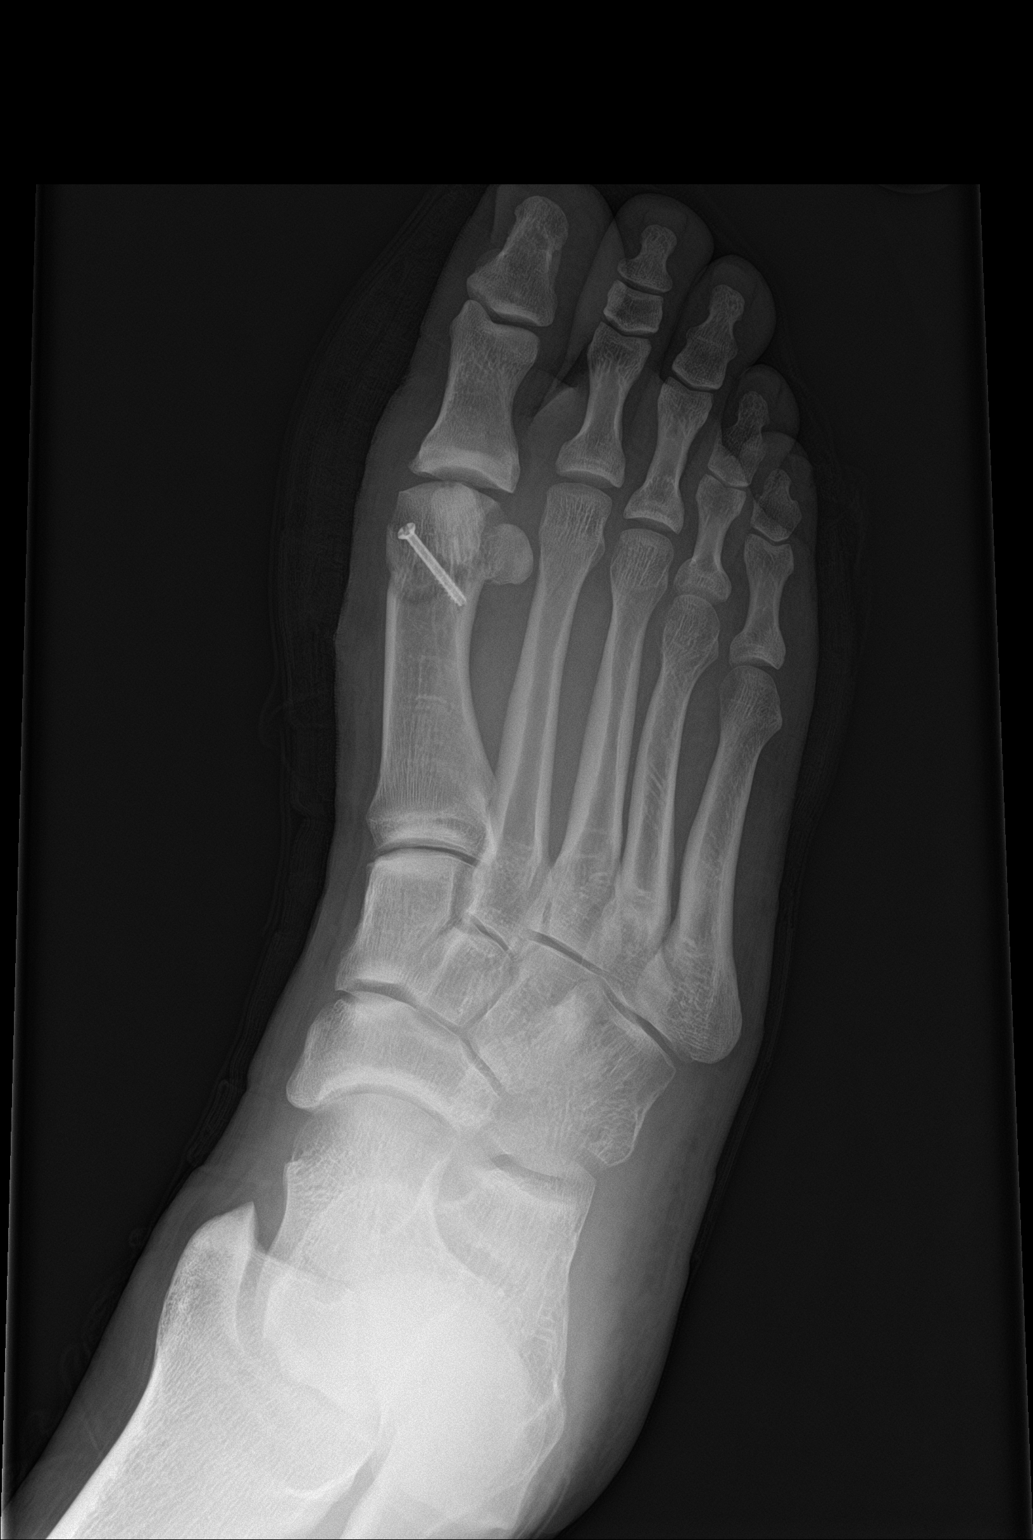

[foot lat]
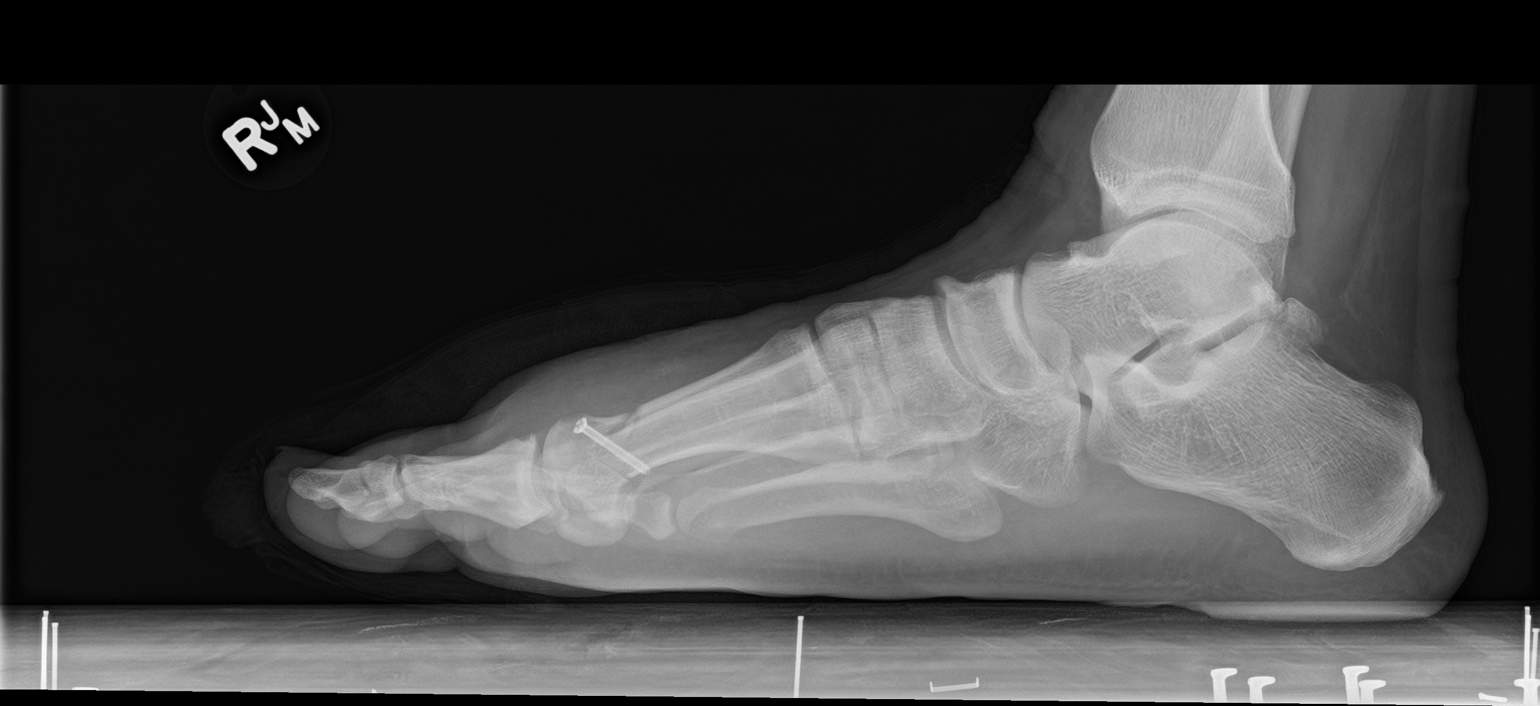

[3 of 3 positions shown; findings below may reference images not displayed]

FINDINGS: No acute fracture or dislocation. Interval hallux valgus repair with
partial osseous fusion across the osteotomy of the distal first
metatarsal transfixed with a cannulated screw.

Mild osteoarthritis of the first MTP joint and first TMT joint. No
other osseous abnormality. Mild soft tissue swelling along the
medial aspect of the first MTP joint.
IMPRESSION: 1. Interval hallux valgus repair with partial osseous fusion across
the osteotomy of the distal first metatarsal transfixed with a
cannulated screw.

## 2017-08-23 ENCOUNTER — Telehealth: Payer: Self-pay | Admitting: Family

## 2017-08-23 NOTE — Telephone Encounter (Signed)
Spoke with Tiara today and test results confirmed with her that he tested negative.

## 2017-08-23 NOTE — Telephone Encounter (Signed)
Copied from CRM [phone]. Topic: Quick Communication - See Telephone Encounter >> Aug 23, 2017  8:58 AM Arlyss Gandyichardson, Tayvon Culley N, NT wrote: CRM for notification. See Telephone encounter for: 08/23/17. Tiara with [facility] calling to request a call back from a nurse to confirm that pt tested negative for HIV in May. CB#: [phone].

## 2017-09-14 ENCOUNTER — Telehealth: Payer: Self-pay | Admitting: Family

## 2017-09-14 NOTE — Telephone Encounter (Signed)
Can you see what medication he is needing refilled? We may be able to fill it without him having to come in tomorrow. He is welcome if he needs us but just seeing if we can help him out.
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
## 2017-09-15 ENCOUNTER — Encounter: Payer: Self-pay | Admitting: Family

## 2017-09-15 ENCOUNTER — Ambulatory Visit: Payer: 59 | Admitting: Family

## 2017-09-15 VITALS — BP 138/90 | HR 98 | Temp 98.2°F | Ht 74.0 in | Wt 250.1 lb

## 2017-09-15 DIAGNOSIS — F4310 Post-traumatic stress disorder, unspecified: Secondary | ICD-10-CM | POA: Diagnosis not present

## 2017-09-15 DIAGNOSIS — G4700 Insomnia, unspecified: Secondary | ICD-10-CM

## 2017-09-15 DIAGNOSIS — F418 Other specified anxiety disorders: Secondary | ICD-10-CM | POA: Diagnosis not present

## 2017-09-15 MED ORDER — TRAZODONE HCL 50 MG PO TABS: 50.0000 mg | ORAL_TABLET | Freq: Every evening | ORAL | 0 refills | Status: DC | PRN
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
## 2017-09-20 ENCOUNTER — Telehealth: Payer: Self-pay | Admitting: Family

## 2017-09-20 NOTE — Telephone Encounter (Signed)
Copied from CRM [phone]. Topic: Inquiry >> Sep 20, 2017  3:54 PM Windy KalataMichael, Taylor L, NT wrote: Reason for CRM: patient is calling and states he would like to speak to Ria ClockLaura Murray or her nurse in regards to his last appointment.

## 2017-09-21 ENCOUNTER — Encounter: Payer: Self-pay | Admitting: Family

## 2017-09-21 ENCOUNTER — Other Ambulatory Visit: Payer: Self-pay | Admitting: Family

## 2017-09-21 ENCOUNTER — Telehealth: Payer: Self-pay

## 2017-09-21 NOTE — Telephone Encounter (Signed)
Spoke with patient today. He is set to see the therapist on this coming Monday. In the meantime he wanted to know if you could take him out of work for the weekend? He has been taking the Desyrell for sleep. He stated it has helped a little bit but he was didn't really want to double up on it at this point. Please advise regarding note. He said if you could do note he would like to retrieve it via my-chart if you are able to release it to him or if he needed to come by and pick it up he could do that as well.  Thanks

## 2017-09-21 NOTE — Telephone Encounter (Signed)
Called patient and left message for him to return call to clinic.CRM created incase he calls back to get more info on what he is needing so we know how to better assist him.

## 2017-09-25 NOTE — Telephone Encounter (Signed)
FMLA forms for this leave have been received, Forms have been completed & placed in providers box to review and sign.

## 2017-09-26 DIAGNOSIS — Z0279 Encounter for issue of other medical certificate: Secondary | ICD-10-CM

## 2017-09-26 NOTE — Telephone Encounter (Signed)
Forms have been signed, [facility] @ [phone], Copy sent to scan &charged for.   LVM to inform patient & his original is ready to be picked. If he would like it mailed - we can do that.

## 2017-09-27 DIAGNOSIS — L918 Other hypertrophic disorders of the skin: Secondary | ICD-10-CM | POA: Diagnosis not present

## 2017-09-27 DIAGNOSIS — L700 Acne vulgaris: Secondary | ICD-10-CM | POA: Diagnosis not present

## 2017-10-06 ENCOUNTER — Telehealth: Payer: Self-pay | Admitting: Family

## 2017-10-06 NOTE — Telephone Encounter (Signed)
Pt informed will get documentation from therapists and appt set up for Wednesday

## 2017-10-06 NOTE — Telephone Encounter (Signed)
Copied from CRM [id number]. Topic: General - Other >> Oct 06, 2017  1:17 PM Crist InfanteHarrald, Kathy J wrote: Reason for CRM:  pt states since he was referred to therapy, pt would like his FMLA extended. Pt has been to 2 visits, (one today) and states it is hard to get an appt with them. Pt signed he signed a waiver with [facility] allowing the dr to access his information. Pt not sure how long he needs to be out.  It was extended through today's appt. Therapist is Hope Buddsaylor Shannon  Pt wants to know how to go about getting FMLA extended.   Vernona RiegerLaura, please advise if you okay with this and if you have completed other forms for him. I see I completed one forms for one day back in July. Or if you need him to make an appointment to come and talk about this.  Albin FellingCarla can you follow up with Vernona RiegerLaura on this, if it is not done today. I will be out of office all next week. Thank you.

## 2017-10-06 NOTE — Telephone Encounter (Signed)
Yes, he is going to need an office visit; thanks- It would also be helpful if he could get some type of note from his therapist with what their thoughts are on treatment plan/ length of time that he may need to be out of work. Please bring that note to the follow-up appointment.

## 2017-10-11 ENCOUNTER — Encounter: Payer: Self-pay | Admitting: Family

## 2017-10-11 ENCOUNTER — Ambulatory Visit: Payer: 59 | Admitting: Family

## 2017-10-11 VITALS — BP 132/76 | HR 78 | Temp 97.6°F | Ht 74.0 in | Wt 243.0 lb

## 2017-10-11 DIAGNOSIS — G4700 Insomnia, unspecified: Secondary | ICD-10-CM

## 2017-10-11 DIAGNOSIS — F4310 Post-traumatic stress disorder, unspecified: Secondary | ICD-10-CM

## 2017-10-11 DIAGNOSIS — F418 Other specified anxiety disorders: Secondary | ICD-10-CM | POA: Diagnosis not present

## 2017-10-11 MED ORDER — TRAZODONE HCL 100 MG PO TABS: 150.0000 mg | ORAL_TABLET | Freq: Every evening | ORAL | 1 refills | Status: DC | PRN
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
## 2017-10-11 NOTE — Patient Instructions (Signed)
You have been referred to [facility] in [place] for therapy and med management. Please contact their office directly to schedule your first appointment. Their direct number is [phone]. Below is the physical address of their [place] office:  [address], [place], [place] [address]  

## 2017-10-12 ENCOUNTER — Encounter: Payer: Self-pay | Admitting: Family

## 2017-10-16 NOTE — Telephone Encounter (Signed)
Vernona RiegerLaura, I have received the forms and filled them out the best I could. If you could go over them and fill in any blanks and sign them. I will be happy to faxed them and finish them off. Thank you.

## 2017-10-22 IMAGING — DX DG FOOT COMPLETE 3+V*R*
3 series · 3 of 3 positions shown · non-contrast
Comparison: Multiple prior plain films the right foot dating back
to 12/15/2015.

CLINICAL DATA: History of bunionectomy in December 2015. Continued
pain. No known injury.

EXAM:
RIGHT FOOT COMPLETE - 3+ VIEW

[foot ap]
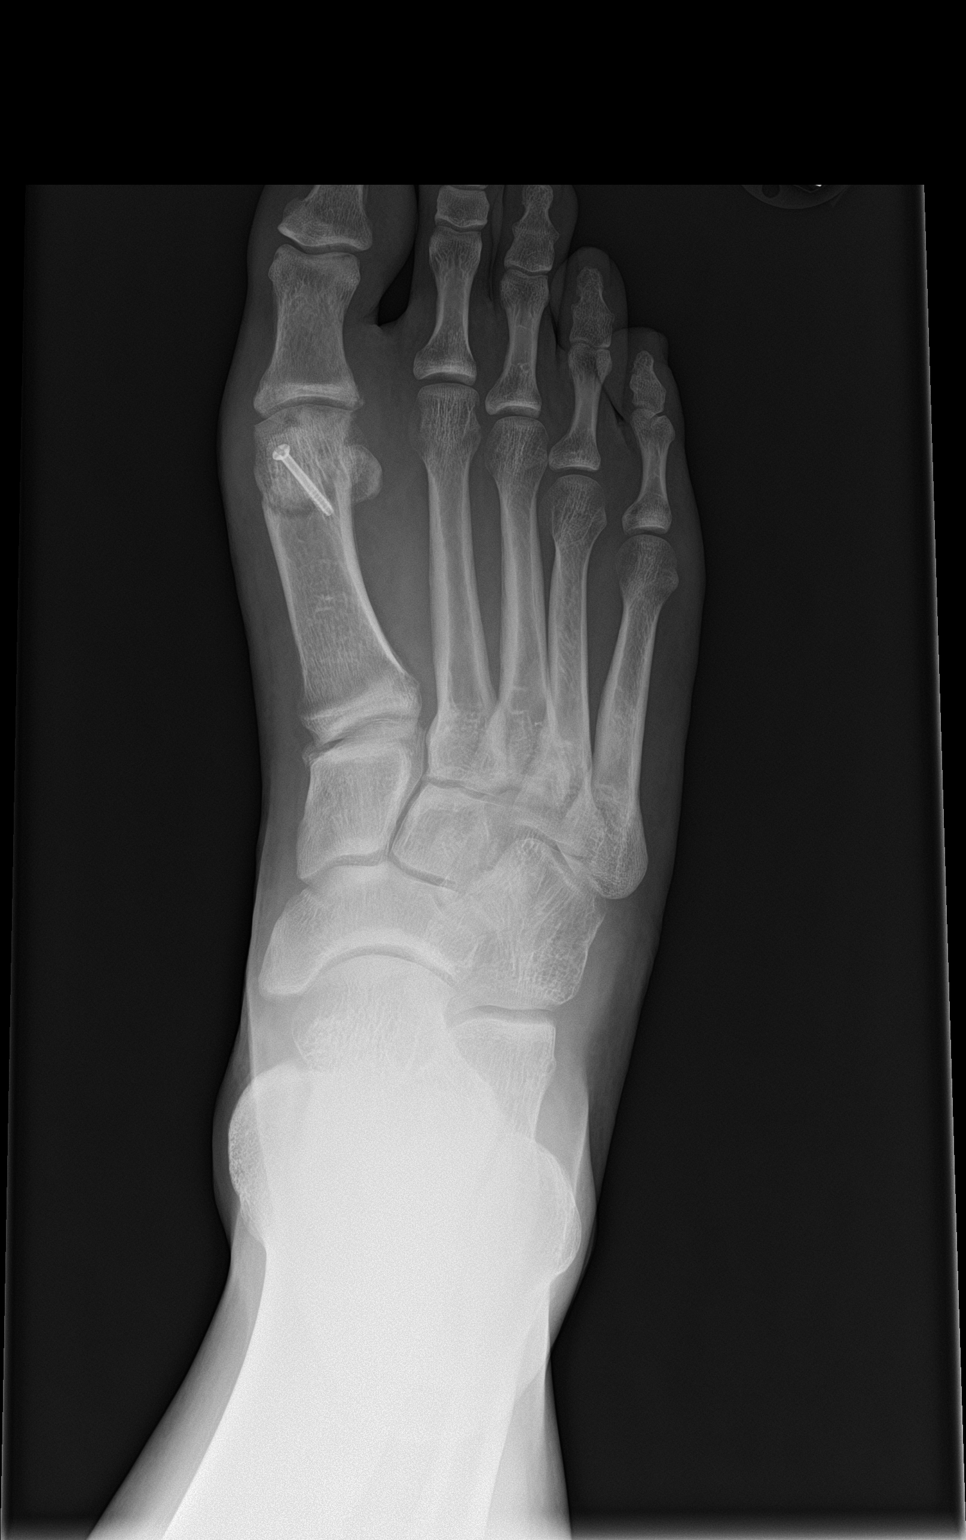

[foot obl]
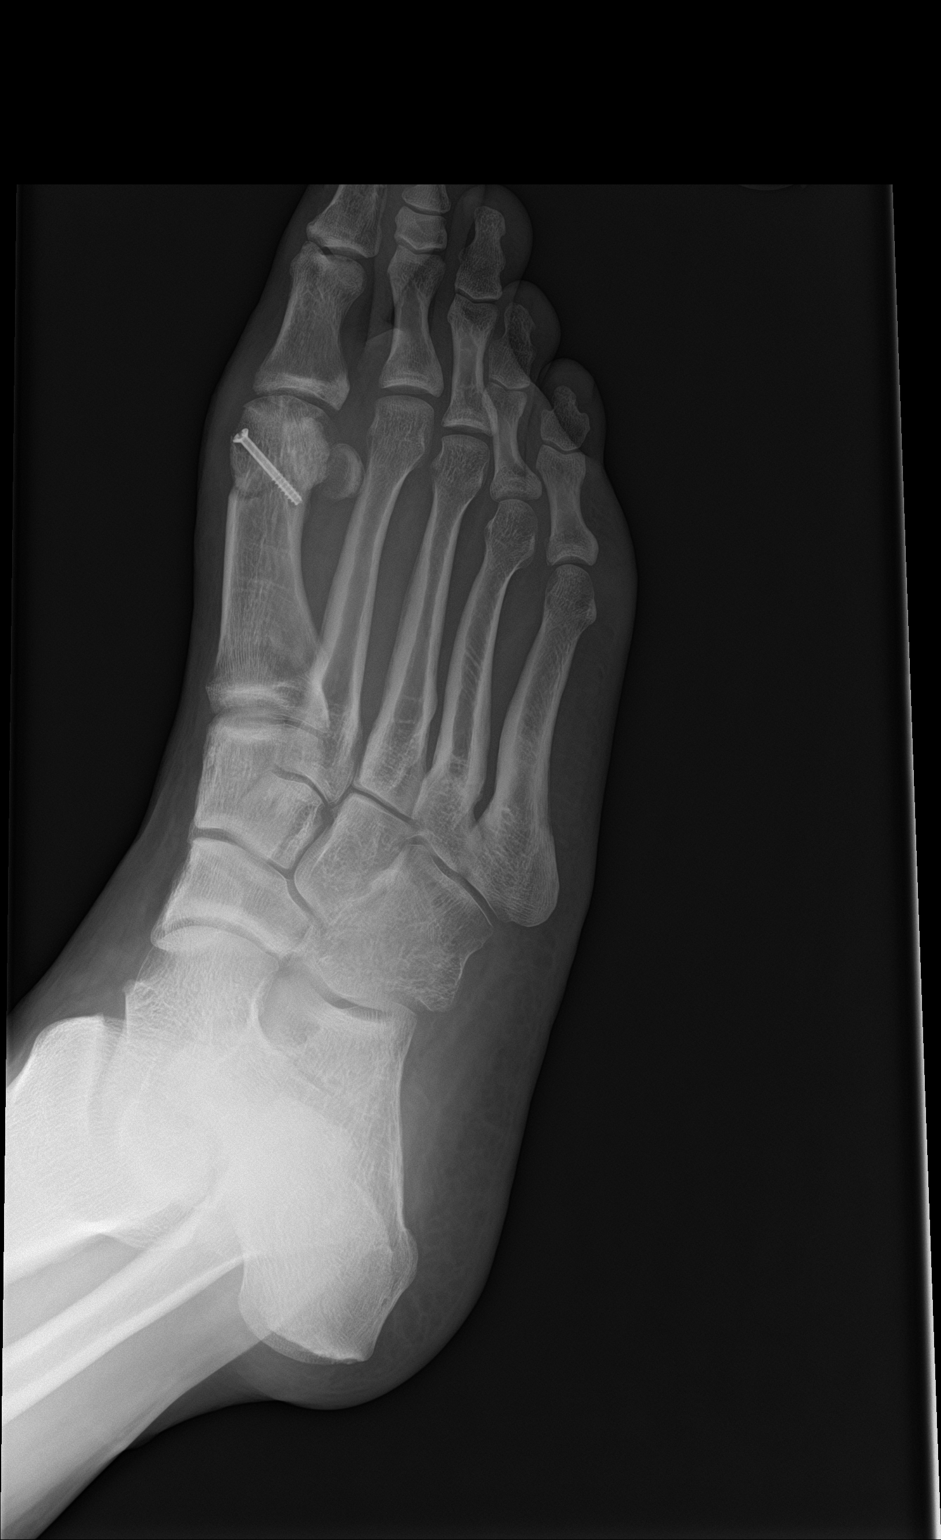

[foot lat]
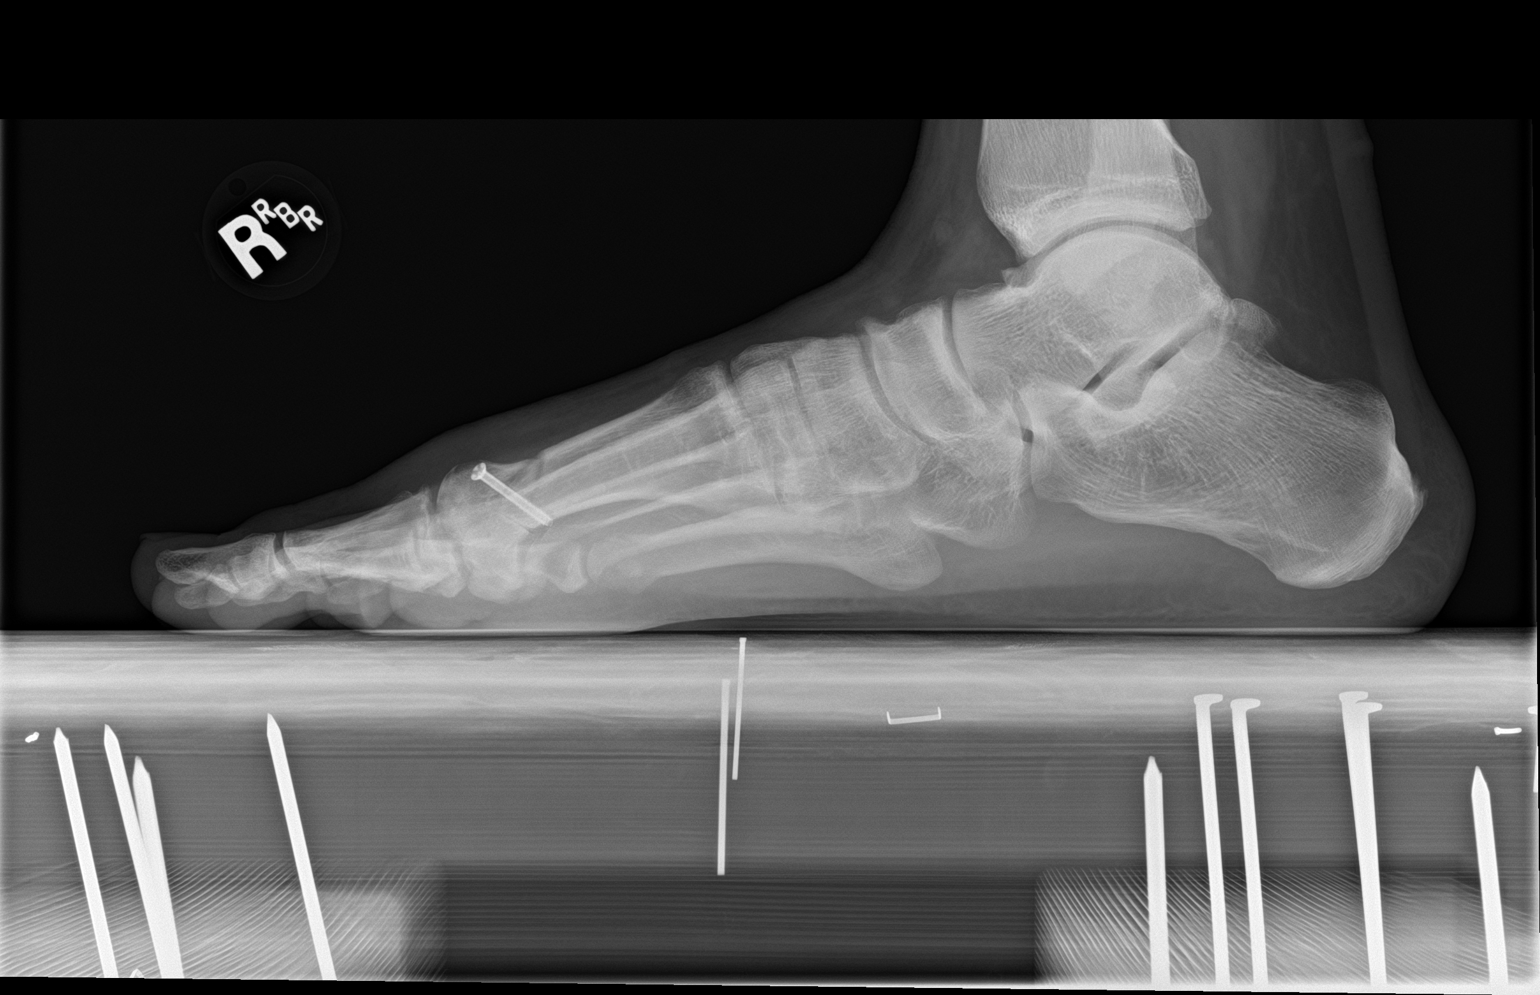

[3 of 3 positions shown; findings below may reference images not displayed]

FINDINGS: The patient is status post bunionectomy with a single screw in place
across the osteotomy defect. Bulk of the osteotomy defect remains
visible with bridging bone seen centrally, not notably change since
01/19/2016. Mild to moderate first MTP osteoarthritis is noted.
Imaged bones and joints otherwise appear normal.
IMPRESSION: No change in the appearance of the patient's right foot with an
osteotomy defect for bunionectomy again identified. Bulk of the
osteotomy cleft remains visible compatible with nonunion.

## 2017-10-24 DIAGNOSIS — Z79891 Long term (current) use of opiate analgesic: Secondary | ICD-10-CM | POA: Diagnosis not present

## 2017-10-27 NOTE — Telephone Encounter (Signed)
Forms have been completed & signed, Faxed to [facility] @ [phone], Copy sent to scan.   Original mailed to patient.

## 2017-11-14 ENCOUNTER — Ambulatory Visit: Payer: 59 | Admitting: Family

## 2018-01-03 ENCOUNTER — Ambulatory Visit: Payer: 59 | Admitting: Allergy and Immunology

## 2018-01-15 ENCOUNTER — Ambulatory Visit: Payer: 59 | Admitting: Family Medicine

## 2018-01-15 DIAGNOSIS — J309 Allergic rhinitis, unspecified: Secondary | ICD-10-CM

## 2018-02-08 ENCOUNTER — Ambulatory Visit: Payer: 59 | Admitting: Internal Medicine

## 2018-02-08 ENCOUNTER — Encounter: Payer: Self-pay | Admitting: Internal Medicine

## 2018-02-08 DIAGNOSIS — H6123 Impacted cerumen, bilateral: Secondary | ICD-10-CM

## 2018-02-08 DIAGNOSIS — J029 Acute pharyngitis, unspecified: Secondary | ICD-10-CM | POA: Diagnosis not present

## 2018-02-08 MED ORDER — LIDOCAINE VISCOUS HCL 2 % MT SOLN: 5.0000 mL | OROMUCOSAL | 0 refills | Status: DC | PRN

## 2018-02-08 NOTE — Patient Instructions (Signed)
We have cleared the ears out.  We have sent in the liquid medicine to swish and swallow to help the sore throat.

## 2018-02-08 NOTE — Progress Notes (Signed)
   Subjective:    Patient ID: Johnny Yang, male    DOB: 1981/01/08, 37 y.o.   MRN: [id number]  HPI The patient is a 37 YO man coming in for sore throat. Going on for about 1 week. Accompanied by cough and ear pressure. Denies fevers or chills. Denies SOB. Overall is improving but then has gotten worse again in the last 1-2 days. Denies headaches. Is taking mucinex and nyquil and tylenol which initially was helping. Does smoke cigars although has not smoked since this started. Takes singulair zyrtec and flonase and is taking those now.   Review of Systems  Constitutional: Negative for activity change, appetite change, chills, fatigue, fever and unexpected weight change.  HENT: Positive for congestion, ear pain, postnasal drip, rhinorrhea and sore throat. Negative for ear discharge, sinus pressure, sinus pain, sneezing, tinnitus, trouble swallowing and voice change.   Eyes: Negative.   Respiratory: Positive for cough. Negative for chest tightness, shortness of breath and wheezing.   Cardiovascular: Negative.   Gastrointestinal: Negative.   Musculoskeletal: Negative.   Neurological: Negative.       Objective:   Physical Exam Constitutional:      Appearance: He is well-developed.  HENT:     Head: Normocephalic and atraumatic.     Right Ear: Tympanic membrane normal.     Left Ear: Tympanic membrane normal.     Ears:     Comments: Ears unable to visualize initially, disimpacted and then TMs were both clear afterwards    Mouth/Throat:     Comments: Oropharynx with redness and clear drainage, no purulent drainage Neck:     Musculoskeletal: Normal range of motion.     Thyroid: No thyromegaly.  Cardiovascular:     Rate and Rhythm: Normal rate and regular rhythm.  Pulmonary:     Effort: Pulmonary effort is normal. No respiratory distress.     Breath sounds: Normal breath sounds. No wheezing or rales.  Abdominal:     Palpations: Abdomen is soft.  Musculoskeletal:        General:  Tenderness present.  Lymphadenopathy:     Cervical: No cervical adenopathy.  Skin:    General: Skin is warm and dry.  Neurological:     Mental Status: He is alert and oriented to person, place, and time.    Vitals:   02/08/18 1101  BP: 118/84  Pulse: 80  Temp: 98.7 F (37.1 C)  TempSrc: Oral  SpO2: 98%  Weight: 232 lb (105.2 kg)  Height: 6\' 2"  (1.88 m)      Assessment & Plan:

## 2018-02-08 NOTE — Assessment & Plan Note (Signed)
Rx for lidocaine viscous. No strep on exam. Advised to continue taking zyrtec, flonase, singulair.

## 2018-10-31 ENCOUNTER — Encounter (HOSPITAL_BASED_OUTPATIENT_CLINIC_OR_DEPARTMENT_OTHER): Payer: Self-pay | Admitting: Emergency Medicine

## 2018-10-31 ENCOUNTER — Ambulatory Visit: Payer: Self-pay | Admitting: *Deleted

## 2018-10-31 ENCOUNTER — Emergency Department (HOSPITAL_BASED_OUTPATIENT_CLINIC_OR_DEPARTMENT_OTHER): Payer: 59

## 2018-10-31 ENCOUNTER — Other Ambulatory Visit: Payer: Self-pay

## 2018-10-31 ENCOUNTER — Emergency Department (HOSPITAL_BASED_OUTPATIENT_CLINIC_OR_DEPARTMENT_OTHER)
Admission: EM | Admit: 2018-10-31 | Discharge: 2018-10-31 | Disposition: A | Discharge status: Home / Self Care | Payer: 59 | Attending: Emergency Medicine | Admitting: Emergency Medicine

## 2018-10-31 DIAGNOSIS — F17200 Nicotine dependence, unspecified, uncomplicated: Secondary | ICD-10-CM | POA: Diagnosis not present

## 2018-10-31 DIAGNOSIS — I10 Essential (primary) hypertension: Secondary | ICD-10-CM | POA: Diagnosis not present

## 2018-10-31 DIAGNOSIS — R0789 Other chest pain: Secondary | ICD-10-CM | POA: Diagnosis not present

## 2018-10-31 DIAGNOSIS — Z79899 Other long term (current) drug therapy: Secondary | ICD-10-CM | POA: Diagnosis not present

## 2018-10-31 DIAGNOSIS — R079 Chest pain, unspecified: Secondary | ICD-10-CM | POA: Diagnosis present

## 2018-10-31 LAB — BASIC METABOLIC PANEL
Anion gap: 8 (ref 5–15)
BUN: 17 mg/dL (ref 6–20)
CO2: 24 mmol/L (ref 22–32)
Calcium: 9.4 mg/dL (ref 8.9–10.3)
Chloride: 106 mmol/L (ref 98–111)
Creatinine, Ser: 1.18 mg/dL (ref 0.61–1.24)
GFR calc Af Amer: >60 mL/min (ref >60)
GFR calc non Af Amer: >60 mL/min (ref >60)
Glucose, Bld: 106 mg/dL — ABNORMAL HIGH (ref 70–99)
Potassium: 3.8 mmol/L (ref 3.5–5.1)
Sodium: 138 mmol/L (ref 135–145)

## 2018-10-31 LAB — TROPONIN I (HIGH SENSITIVITY)
Troponin I (High Sensitivity): 6 ng/L (ref <18)
Troponin I (High Sensitivity): 6 ng/L (ref <18)

## 2018-10-31 LAB — CBC
HCT: 46.9 % (ref 39.0–52.0)
Hemoglobin: 15.4 g/dL (ref 13.0–17.0)
MCH: 30.4 pg (ref 26.0–34.0)
MCHC: 32.8 g/dL (ref 30.0–36.0)
MCV: 92.5 fL (ref 80.0–100.0)
Platelets: 285 10*3/uL (ref 150–400)
RBC: 5.07 MIL/uL (ref 4.22–5.81)
RDW: 12 % (ref 11.5–15.5)
WBC: 9.5 10*3/uL (ref 4.0–10.5)
nRBC: 0 % (ref 0.0–0.2)

## 2018-10-31 MED ORDER — FAMOTIDINE 20 MG PO TABS: 20.0000 mg | ORAL_TABLET | Freq: Every day | ORAL | 0 refills | Status: DC

## 2018-10-31 MED ORDER — KETOROLAC TROMETHAMINE 30 MG/ML IJ SOLN
30.0000 mg | Freq: Once | INTRAMUSCULAR | Status: AC
  Administered 2018-10-31: 30 mg via INTRAVENOUS
  Filled 2018-10-31: qty 1

## 2018-10-31 NOTE — ED Triage Notes (Signed)
Pt reports recurrent left chest pressure , worsening with inspiration and movement. Also reports shortness of breath, denies pain radiation , nor coughing . No fever. Alert and oriented,  Tested negative  for covid-19  2 weeks ago.

## 2018-10-31 NOTE — ED Provider Notes (Signed)
[facility] EMERGENCY DEPARTMENT Provider Note   CSN: [id number] Arrival date & time: 10/31/18  0907     History   Chief Complaint Chief Complaint  Patient presents with  . Chest Pain    HPI Johnny Yang is a 38 y.o. male.     Pt presents to the ED today with cp and sob.  The pt said it's been going on for a few days.  He denies f/c.  He was tested for covid 2 weeks ago and was negative.  Pt also said he's been belching a lot.        Past Medical History:  Diagnosis Date  . Allergy    seasonal  . Urticaria     Patient Active Problem List   Diagnosis Date Noted  . Sore throat 02/08/2018  . 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 Allergic conjunctivitis 08/18/2017  . 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 Testicular pain 05/28/2015  . ANXIETY STATE, UNSPECIFIED 10/31/2008  . Seasonal and perennial allergic rhinitis 10/31/2008  . Migraine equivalent 10/31/2008    Past Surgical History:  Procedure Laterality Date  . FOOT SURGERY Left   . TONSILLECTOMY AND ADENOIDECTOMY          Home Medications    Prior to Admission medications   Medication Sig Start Date End Date Taking? Authorizing Provider  cetirizine (ZYRTEC) 10 MG tablet Take 10 mg by mouth daily.    [provider]  famotidine (PEPCID) 20 MG tablet Take 1 tablet (20 mg total) by mouth daily. 10/31/18   Jacalyn Lefevre, MD  fluticasone Aleda Grana) 50 MCG/ACT nasal spray One spray per nostril 1-2 times daily as needed for stuffy nose 08/18/17   Bobbitt, Heywood Iles, MD  levocetirizine (XYZAL) 5 MG tablet 1 tablet once a day if needed for runny nose or itching. 08/18/17   Bobbitt, Heywood Iles, MD  lidocaine (XYLOCAINE) 2 % solution Use as directed 5-10 mLs in the mouth or throat every 3 (three) hours as needed for mouth pain. 02/08/18   Myrlene Broker, MD  mometasone (ELOCON) 0.1 % cream Apply 1 application topically daily. 08/07/17   Olive Bass, FNP  montelukast  (SINGULAIR) 10 MG tablet 1 tablet once at night for coughing or wheezing. 08/18/17   Bobbitt, Heywood Iles, MD  Olopatadine HCl (PATADAY) 0.2 % SOLN 1 drop each eye daily if needed for itchy eyes. 08/18/17   Bobbitt, Heywood Iles, MD  ranitidine (ZANTAC) 150 MG tablet Take 1 tablet (150 mg total) by mouth 2 (two) times daily. 07/26/17   Olive Bass, FNP  traZODone (DESYREL) 100 MG tablet Take 1.5 tablets (150 mg total) by mouth at bedtime as needed for sleep. 10/11/17   Olive Bass, FNP    Family History Family History  Problem Relation Age of Onset  . Cancer Paternal Aunt        ovarian  . Allergic rhinitis Mother   . Allergic rhinitis Father   . Asthma Neg Hx   . Angioedema Neg Hx   . Eczema Neg Hx   . Immunodeficiency Neg Hx   . Urticaria Neg Hx     Social History Social History   Tobacco Use  . Smoking status: Current Every Day Smoker    Types: Cigars  . Smokeless tobacco: Never Used  . Tobacco comment: 1 cigar / day  Substance Use Topics  . Alcohol use: Yes    Comment:  occasionally as 2-3 beverages 2-3 X/ week  .  Drug use: No     Allergies   Patient has no known allergies.   Review of Systems Review of Systems  Cardiovascular: Positive for chest pain.  All other systems reviewed and are negative.    Physical Exam Updated Vital Signs BP (!) 161/104   Pulse 61   Temp 98.4 F (36.9 C) (Oral)   Resp (!) 21   Ht 6' 2.5" (1.892 m)   Wt 108.9 kg   SpO2 99%   BMI 30.40 kg/m   Physical Exam Vitals signs and nursing note reviewed.  Constitutional:      Appearance: He is well-developed.  HENT:     Head: Normocephalic and atraumatic.  Eyes:     Extraocular Movements: Extraocular movements intact.     Pupils: Pupils are equal, round, and reactive to light.  Neck:     Musculoskeletal: Normal range of motion and neck supple.  Cardiovascular:     Rate and Rhythm: Normal rate and regular rhythm.     Heart sounds: Normal heart sounds.   Pulmonary:     Breath sounds: Normal breath sounds.  Chest:    Abdominal:     General: Bowel sounds are normal.     Palpations: Abdomen is soft.  Musculoskeletal: Normal range of motion.  Skin:    General: Skin is warm and dry.     Capillary Refill: Capillary refill takes less than 2 seconds.  Neurological:     General: No focal deficit present.     Mental Status: He is alert and oriented to person, place, and time.  Psychiatric:        Mood and Affect: Mood normal.        Behavior: Behavior normal.      ED Treatments / Results  Labs (all labs ordered are listed, but only abnormal results are displayed) Labs Reviewed  BASIC METABOLIC PANEL - Abnormal; Notable for the following components:      Result Value   Glucose, Bld 106 (*)    All other components within normal limits  CBC  TROPONIN I (HIGH SENSITIVITY)  TROPONIN I (HIGH SENSITIVITY)    EKG EKG Interpretation  Date/Time:  Wednesday October 31 2018 09:16:05 EDT Ventricular Rate:  80 PR Interval:    QRS Duration: 95 QT Interval:  353 QTC Calculation: 408 R Axis:   59 Text Interpretation:  Sinus rhythm Probable left ventricular hypertrophy No old tracing to compare Confirmed by Isla Pence [phone]) on 10/31/2018 9:18:50 AM   Radiology Dg Chest 2 View  Result Date: 10/31/2018 CLINICAL DATA:  Recurrent chest pressure, shortness of breath EXAM: CHEST - 2 VIEW COMPARISON:  None. FINDINGS: The heart size and mediastinal contours are within normal limits. Both lungs are clear. The visualized skeletal structures are unremarkable. IMPRESSION: No acute abnormality of the lungs. Electronically Signed   By: Eddie Candle M.D.   On: 10/31/2018 09:53    Procedures Procedures (including critical care time)  Medications Ordered in ED Medications  ketorolac (TORADOL) 30 MG/ML injection 30 mg (30 mg Intravenous Given 10/31/18 0956)     Initial Impression / Assessment and Plan / ED Course  I have reviewed the triage  vital signs and the nursing notes.  Pertinent labs & imaging results that were available during my care of the patient were reviewed by me and considered in my medical decision making (see chart for details).  Pt is feeling much better.  He has a heart score of 1.  His bp was elevated here  with some lvh on ekg.  He is told to f/u with his pcp regarding his bp.  He knows to return if worse.    Final Clinical Impressions(s) / ED Diagnoses   Final diagnoses:  Atypical chest pain  Essential hypertension    ED Discharge Orders         Ordered    famotidine (PEPCID) 20 MG tablet  Daily     10/31/18 1311           Jacalyn LefevreHaviland, Clara Herbison, MD 10/31/18 1312

## 2018-10-31 NOTE — Telephone Encounter (Signed)
Pt called in c/o left sided chest pain that started last week and has gotten worse.   He thought it was heartburn and it would go away but heartburn medication has not helped it. I have referred him to the ED.   He is going to [facility] on [address] and [place].    "I live close to there so I'll drive myself there".    I let him know he could call 911.   "I'm fine to drive myself there". I sent my notes to Carolyne Fiscal, [place] office so she would be away of the ED referral.   Reason for Disposition . [1] Chest pain lasts > 5 minutes AND [2] age > 38 AND [3] one or more cardiac risk factors (e.g., diabetes, high blood pressure, high cholesterol, smoker, or strong family history of heart disease)  Answer Assessment - Initial Assessment Questions 1. LOCATION: "Where does it hurt?"       Hurts on the left side of my chest.   I thought it was heart burn but I'm taking medicine for it and it has not helped.    It hurts more when I lay in different positions. 2. RADIATION: "Does the pain go anywhere else?" (e.g., into neck, jaw, arms, back)     Stays in one place 3. ONSET: "When did the chest pain begin?" (Minutes, hours or days)      Started last week and then came back.   I thought it would go away.   4. PATTERN "Does the pain come and go, or has it been constant since it started?"  "Does it get worse with exertion?"      Comes and goes 5. DURATION: "How long does it last" (e.g., seconds, minutes, hours)     It's mostly constant now. 6. SEVERITY: "How bad is the pain?"  (e.g., Scale 1-10; mild, moderate, or severe)    - MILD (1-3): doesn't interfere with normal activities     - MODERATE (4-7): interferes with normal activities or awakens from sleep    - SEVERE (8-10): excruciating pain, unable to do any normal activities       8 on scale 7. CARDIAC RISK FACTORS: "Do you have any history of heart problems or risk factors for heart disease?" (e.g., angina, prior heart attack;  diabetes, high blood pressure, high cholesterol, smoker, or strong family history of heart disease)     I don't think so.   I do smoke.   No family historoy. 8. PULMONARY RISK FACTORS: "Do you have any history of lung disease?"  (e.g., blood clots in lung, asthma, emphysema, birth control pills)     I smoke.   I might have asthma.   No blood clot history in lungs or legs.   I'm breathing ok but they just don't feel full. 9. CAUSE: "What do you think is causing the chest pain?"     I don't know 10. OTHER SYMPTOMS: "Do you have any other symptoms?" (e.g., dizziness, nausea, vomiting, sweating, fever, difficulty breathing, cough)       Just tired, pain in chest, no nausea or diarrhea.  No fever or having sweats. 11. PREGNANCY: "Is there any chance you are pregnant?" "When was your last menstrual period?"       N/A  Protocols used: CHEST PAIN-A-AH

## 2018-12-19 ENCOUNTER — Other Ambulatory Visit: Payer: Self-pay

## 2018-12-19 DIAGNOSIS — Z20822 Contact with and (suspected) exposure to covid-19: Secondary | ICD-10-CM

## 2018-12-21 LAB — NOVEL CORONAVIRUS, NAA: SARS-CoV-2, NAA: NOT DETECTED

## 2019-01-16 ENCOUNTER — Other Ambulatory Visit: Payer: Self-pay

## 2019-01-16 ENCOUNTER — Ambulatory Visit (INDEPENDENT_AMBULATORY_CARE_PROVIDER_SITE_OTHER): Payer: 59 | Admitting: Family

## 2019-01-16 ENCOUNTER — Encounter: Payer: Self-pay | Admitting: Family

## 2019-01-16 VITALS — BP 122/88 | HR 85 | Temp 97.9°F | Ht 74.5 in | Wt 245.2 lb

## 2019-01-16 DIAGNOSIS — R0683 Snoring: Secondary | ICD-10-CM | POA: Diagnosis not present

## 2019-01-16 DIAGNOSIS — R0681 Apnea, not elsewhere classified: Secondary | ICD-10-CM

## 2019-01-16 NOTE — Progress Notes (Signed)
  Johnny Yang is a 38 y.o. male with the following history as recorded in [employer]:  Patient Active Problem List   Diagnosis Date Noted  . Sore throat 02/08/2018  . 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 Allergic conjunctivitis 08/18/2017  . 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 Testicular pain 05/28/2015  . ANXIETY STATE, UNSPECIFIED 10/31/2008  . Seasonal and perennial allergic rhinitis 10/31/2008  . Migraine equivalent 10/31/2008    Current Outpatient Medications  Medication Sig Dispense Refill  . cetirizine (ZYRTEC) 10 MG tablet Take 10 mg by mouth daily.    . mometasone (ELOCON) 0.1 % cream Apply 1 application topically daily. (Patient not taking: Reported on 01/16/2019) 45 g 0   No current facility-administered medications for this visit.     Allergies: Patient has no known allergies.  Past Medical History:  Diagnosis Date  . Allergy    seasonal  . Urticaria     Past Surgical History:  Procedure Laterality Date  . FOOT SURGERY Left   . TONSILLECTOMY AND ADENOIDECTOMY      Family History  Problem Relation Age of Onset  . Cancer Paternal Aunt        ovarian  . Allergic rhinitis Mother   . Allergic rhinitis Father   . Asthma Neg Hx   . Angioedema Neg Hx   . Eczema Neg Hx   . Immunodeficiency Neg Hx   . Urticaria Neg Hx     Social History   Tobacco Use  . Smoking status: Light Tobacco Smoker    Types: Cigars  . Smokeless tobacco: Never Used  . Tobacco comment: 2x per month- cigar  Substance Use Topics  . Alcohol use: Yes    Comment:  occasionally as 2-3 beverages 2-3 X/ week    Subjective:  Patient concerned that he has sleep apnea; having increased problems with snoring- does wake himself up snoring; also notes that he wakes up suddenly feeling like he has stopped breathing;      Objective:  Vitals:   01/16/19 0942  BP: 122/88  Pulse: 85  Temp: 97.9 F (36.6 C)  TempSrc: Oral  SpO2: 97%  Weight: 245 lb 3.2 oz (111.2 kg)  Height: 6' 2.5"  (1.892 m)    General: Well developed, well nourished, in no acute distress  Skin : Warm and dry.  Head: Normocephalic and atraumatic  Lungs: Respirations unlabored; clear to auscultation bilaterally without wheeze, rales, rhonchi  CVS exam: normal rate and regular rhythm.  Neurologic: Alert and oriented; speech intact; face symmetrical; moves all extremities well; CNII-XII intact without focal deficit  Assessment:  1. Snoring   2. Witnessed episode of apnea     Plan:  Refer for sleep study as requested; encouraged to continue regular exercise as required by his job; follow-up as needed otherwise.    No follow-ups on file.  Orders Placed This Encounter  Procedures  . Ambulatory referral to Neurology    Referral Priority:   Routine    Referral Type:   Consultation    Referral Reason:   Specialty Services Required    Requested Specialty:   Neurology    Number of Visits Requested:   1    Requested Prescriptions    No prescriptions requested or ordered in this encounter

## 2019-01-31 ENCOUNTER — Ambulatory Visit: Payer: 59 | Admitting: Neurology

## 2019-01-31 ENCOUNTER — Other Ambulatory Visit: Payer: Self-pay

## 2019-01-31 ENCOUNTER — Encounter: Payer: Self-pay | Admitting: Neurology

## 2019-01-31 VITALS — BP 149/95 | HR 79 | Temp 97.7°F | Ht 74.0 in | Wt 245.5 lb

## 2019-01-31 DIAGNOSIS — R0683 Snoring: Secondary | ICD-10-CM

## 2019-01-31 DIAGNOSIS — G4726 Circadian rhythm sleep disorder, shift work type: Secondary | ICD-10-CM

## 2019-01-31 DIAGNOSIS — J3089 Other allergic rhinitis: Secondary | ICD-10-CM | POA: Diagnosis not present

## 2019-01-31 DIAGNOSIS — R0689 Other abnormalities of breathing: Secondary | ICD-10-CM

## 2019-01-31 DIAGNOSIS — G43109 Migraine with aura, not intractable, without status migrainosus: Secondary | ICD-10-CM

## 2019-01-31 DIAGNOSIS — G4719 Other hypersomnia: Secondary | ICD-10-CM | POA: Insufficient documentation

## 2019-01-31 DIAGNOSIS — F4310 Post-traumatic stress disorder, unspecified: Secondary | ICD-10-CM

## 2019-01-31 MED ORDER — MODAFINIL 200 MG PO TABS: 100.0000 mg | ORAL_TABLET | Freq: Every day | ORAL | 5 refills | Status: DC
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
## 2019-01-31 NOTE — Patient Instructions (Signed)
Modafinil tablets What is this medicine? MODAFINIL (moe DAF i nil) is used to treat excessive sleepiness caused by certain sleep disorders. This includes narcolepsy, sleep apnea, and shift work sleep disorder. This medicine may be used for other purposes; ask your health care provider or pharmacist if you have questions. COMMON BRAND NAME(S): Provigil What should I tell my health care provider before I take this medicine? They need to know if you have any of these conditions:  history of depression, mania, or other mental disorder  kidney disease  liver disease  an unusual or allergic reaction to modafinil, other medicines, foods, dyes, or preservatives  pregnant or trying to get pregnant  breast-feeding How should I use this medicine? Take this medicine by mouth with a glass of water. Follow the directions on the prescription label. Take your doses at regular intervals. Do not take your medicine more often than directed. Do not stop taking except on your doctor's advice. A special MedGuide will be given to you by the pharmacist with each prescription and refill. Be sure to read this information carefully each time. Talk to your pediatrician regarding the use of this medicine in children. This medicine is not approved for use in children. Overdosage: If you think you have taken too much of this medicine contact a poison control center or emergency room at once. NOTE: This medicine is only for you. Do not share this medicine with others. What if I miss a dose? If you miss a dose, take it as soon as you can. If it is almost time for your next dose, take only that dose. Do not take double or extra doses. What may interact with this medicine? Do not take this medicine with any of the following medications:  amphetamine or dextroamphetamine  dexmethylphenidate or methylphenidate  medicines called MAO Inhibitors like Nardil, Parnate, Marplan, Eldepryl  pemoline  procarbazine This  medicine may also interact with the following medications:  antifungal medicines like itraconazole or ketoconazole  barbiturates like phenobarbital  birth control pills or other hormone-containing birth control devices or implants  carbamazepine  cyclosporine  diazepam  medicines for depression, anxiety, or psychotic disturbances  phenytoin  propranolol  triazolam  warfarin This list may not describe all possible interactions. Give your health care provider a list of all the medicines, herbs, non-prescription drugs, or dietary supplements you use. Also tell them if you smoke, drink alcohol, or use illegal drugs. Some items may interact with your medicine. What should I watch for while using this medicine? Visit your doctor or health care provider for regular checks on your progress. The full effects of this medicine may not be seen right away. This medicine may cause serious skin reactions. They can happen weeks to months after starting the medicine. Contact your health care provider right away if you notice fevers or flu-like symptoms with a rash. The rash may be red or purple and then turn into blisters or peeling of the skin. Or, you might notice a red rash with swelling of the face, lips or lymph nodes in your neck or under your arms. This medicine may affect your concentration, function, or may hide signs that you are tired. You may get dizzy. Do not drive, use machinery, or do anything that needs mental alertness until you know how this drug affects you. Alcohol can make you more dizzy and may interfere with your response to this medicine or your alertness. Avoid alcoholic drinks. Birth control pills may not work properly while   you are taking this medicine. Talk to your doctor about using an extra method of birth control. It is unknown if the effects of this medicine will be increased by the use of caffeine. Caffeine is available in many foods, beverages, and medications. Ask your  doctor if you should limit or change your intake of caffeine-containing products while on this medicine. What side effects may I notice from receiving this medicine? Side effects that you should report to your doctor or health care professional as soon as possible:  allergic reactions like skin rash, itching or hives, swelling of the face, lips, or tongue  anxiety  breathing problems  chest pain  fast, irregular heartbeat  hallucinations  increased blood pressure  rash, fever, and swollen lymph nodes  redness, blistering, peeling or loosening of the skin, including inside the mouth  sore throat, fever, or chills  suicidal thoughts or other mood changes  tremors  vomiting Side effects that usually do not require medical attention (report to your doctor or health care professional if they continue or are bothersome):  headache  nausea, diarrhea, or stomach upset  nervousness  trouble sleeping This list may not describe all possible side effects. Call your doctor for medical advice about side effects. You may report side effects to FDA at 1-800-FDA-1088. Where should I keep my medicine? Keep out of the reach of children. This medicine can be abused. Keep your medicine in a safe place to protect it from theft. Do not share this medicine with anyone. Selling or giving away this medicine is dangerous and against the law. This medicine may cause accidental overdose and death if taken by other adults, children, or pets. Mix any unused medicine with a substance like cat litter or coffee grounds. Then throw the medicine away in a sealed container like a sealed bag or a coffee can with a lid. Do not use the medicine after the expiration date. Store at room temperature between 20 and 25 degrees C (68 and 77 degrees F). NOTE: This sheet is a summary. It may not cover all possible information. If you have questions about this medicine, talk to your doctor, pharmacist, or health care  provider.  2020 Elsevier/Gold Standard (2018-05-23 10:08:08)  

## 2019-02-18 ENCOUNTER — Ambulatory Visit (INDEPENDENT_AMBULATORY_CARE_PROVIDER_SITE_OTHER): Payer: 59 | Admitting: Neurology

## 2019-02-18 DIAGNOSIS — R0689 Other abnormalities of breathing: Secondary | ICD-10-CM

## 2019-02-18 DIAGNOSIS — G4719 Other hypersomnia: Secondary | ICD-10-CM

## 2019-02-18 DIAGNOSIS — G4733 Obstructive sleep apnea (adult) (pediatric): Secondary | ICD-10-CM | POA: Diagnosis not present

## 2019-02-18 DIAGNOSIS — F4310 Post-traumatic stress disorder, unspecified: Secondary | ICD-10-CM

## 2019-02-18 DIAGNOSIS — G43109 Migraine with aura, not intractable, without status migrainosus: Secondary | ICD-10-CM

## 2019-02-18 DIAGNOSIS — J3089 Other allergic rhinitis: Secondary | ICD-10-CM

## 2019-02-18 DIAGNOSIS — R0683 Snoring: Secondary | ICD-10-CM

## 2019-02-18 DIAGNOSIS — G4726 Circadian rhythm sleep disorder, shift work type: Secondary | ICD-10-CM

## 2019-03-19 ENCOUNTER — Telehealth: Payer: Self-pay | Admitting: Neurology

## 2019-03-19 NOTE — Progress Notes (Signed)
There is moderate Sleep apnea noted at AHI 18.3/h with strong REM  sleep accentuation to 38.2/h.  In supine, his AHI is 21.2/h. There was no prolonged hypoxemia,  and heart rate variability was normal.   Recommendations:     REM dependent Sleep Apnea is best treated by CPAP, additional  reduction in AHI can be achieved by avoiding supine sleep and by  weight loss. A dental device is usually not efficacious for REM  dependent sleep disordered breathing
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
## 2019-03-19 NOTE — Telephone Encounter (Signed)
-----   Message from Melvyn Novas, MD sent at 03/19/2019 10:15 AM EST ----- There is moderate Sleep apnea noted at AHI 18.3/h with strong REM  sleep accentuation to 38.2/h.  In supine, his AHI is 21.2/h. There was no prolonged hypoxemia,  and heart rate variability was normal.   Recommendations:     REM dependent Sleep Apnea is best treated by CPAP, additional  reduction in AHI can be achieved by avoiding supine sleep and by  weight loss. A dental device is usually not efficacious for REM  dependent sleep disordered breathing

## 2019-03-19 NOTE — Telephone Encounter (Signed)
Called patient to discuss sleep study results. No answer at this time. LVM for the patient to call back.   

## 2019-03-19 NOTE — Addendum Note (Signed)
Addended by: Melvyn Novas on: 03/19/2019 10:15 AM   Modules accepted: Orders

## 2019-03-20 ENCOUNTER — Encounter: Payer: Self-pay | Admitting: Neurology

## 2019-03-26 ENCOUNTER — Encounter: Payer: Self-pay | Admitting: Neurology

## 2019-06-18 ENCOUNTER — Encounter: Payer: Self-pay | Admitting: Family

## 2019-06-18 ENCOUNTER — Ambulatory Visit
Admission: RE | Admit: 2019-06-18 | Discharge: 2019-06-18 | Disposition: A | Discharge status: Home / Self Care | Payer: 59 | Source: Ambulatory Visit | Attending: Family | Admitting: Family

## 2019-06-18 ENCOUNTER — Other Ambulatory Visit: Payer: Self-pay

## 2019-06-18 ENCOUNTER — Telehealth: Payer: Self-pay

## 2019-06-18 ENCOUNTER — Ambulatory Visit: Payer: 59 | Admitting: Family

## 2019-06-18 VITALS — BP 120/82 | HR 79 | Temp 98.9°F | Wt 239.8 lb

## 2019-06-18 DIAGNOSIS — R519 Headache, unspecified: Secondary | ICD-10-CM

## 2019-06-18 DIAGNOSIS — S0990XA Unspecified injury of head, initial encounter: Secondary | ICD-10-CM | POA: Diagnosis not present

## 2019-06-18 NOTE — Patient Instructions (Signed)
Please go to [facility]; they will check the picture of your brain as we discussed.

## 2019-06-18 NOTE — Telephone Encounter (Signed)
New message   The patient was seen today and forgot to ask for an excuse work note for tonight.   Please advise.

## 2019-06-18 NOTE — Telephone Encounter (Signed)
Pt also sent mychart msg requesting note. We have address him via mychart. Closing this encounter.Marland KitchenRaechel Chute
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
## 2019-06-18 NOTE — Telephone Encounter (Signed)
Requesting work note for today.Johnny Yang

## 2019-06-19 ENCOUNTER — Other Ambulatory Visit: Payer: Self-pay | Admitting: Family

## 2020-01-05 ENCOUNTER — Telehealth: Payer: 59 | Admitting: Family

## 2020-01-05 DIAGNOSIS — R059 Cough, unspecified: Secondary | ICD-10-CM

## 2020-01-05 MED ORDER — FLUTICASONE PROPIONATE 50 MCG/ACT NA SUSP: 2.0000 | Freq: Every day | NASAL | 6 refills | Status: DC

## 2020-01-05 MED ORDER — BENZONATATE 100 MG PO CAPS: 100.0000 mg | ORAL_CAPSULE | Freq: Three times a day (TID) | ORAL | 0 refills | Status: DC | PRN

## 2020-01-05 MED ORDER — DEXAMETHASONE 6 MG PO TABS: 6.0000 mg | ORAL_TABLET | Freq: Every day | ORAL | 0 refills | Status: DC

## 2020-01-05 MED ORDER — ALBUTEROL SULFATE HFA 108 (90 BASE) MCG/ACT IN AERS: 2.0000 | INHALATION_SPRAY | Freq: Four times a day (QID) | RESPIRATORY_TRACT | 0 refills | Status: DC | PRN

## 2020-01-05 NOTE — Progress Notes (Signed)
E-Visit for Corona Virus Screening  Your current symptoms could be consistent with the coronavirus.  Many health care providers can now test patients at their office but not all are.  [facility] has multiple testing sites. For information on our COVID testing locations and hours go to [email or web address]  We are enrolling you in our MyChart Home Monitoring for COVID19 . Daily you will receive a questionnaire within the MyChart website. Our COVID 19 response team will be monitoring your responses daily.  Testing Information: The COVID-19 Community Testing sites are testing BY APPOINTMENT ONLY.  You can schedule online at [email or web address]  If you do not have access to a smart phone or computer you may call [phone] for an appointment.   Additional testing sites in the Community:  . For [facility] Testing sites in [facility]  [email or web address]  . For Pop-up testing sites in [place]  [email or web address]  . For [facility] [email or web address]  . For [facility] testing in [place] and [employer] [email or web address]  . For [facility] testing in [facility]   [email or web address]  For  more information about community testing call [phone]   Please quarantine yourself while awaiting your test results. Please stay home for a minimum of 10 days from the first day of illness with improving symptoms and you have had 24 hours of no fever (without the use of Tylenol (Acetaminophen) Motrin (Ibuprofen) or any fever reducing medication).  Also - Do not get tested prior to returning to work because once you have had a positive test the test can stay  positive for more than a month in some cases.   You should wear a mask or cloth face covering over your nose and mouth if you must be around other people or animals, including pets (even at home). Try to stay at least 6 feet away from other people. This will protect the people around you.  Please continue good preventive care measures, including:  frequent hand-washing, avoid touching your face, cover coughs/sneezes, stay out of crowds and keep a 6 foot distance from others.  COVID-19 is a respiratory illness with symptoms that are similar to the flu. Symptoms are typically mild to moderate, but there have been cases of severe illness and death due to the virus.   The following symptoms may appear 2-14 days after exposure: . Fever . Cough . Shortness of breath or difficulty breathing . Chills . Repeated shaking with chills . Muscle pain . Headache . Sore throat . New loss of taste or smell . Fatigue . Congestion or runny nose . Nausea or vomiting . Diarrhea  Go to the nearest hospital ED for assessment if fever/cough/breathlessness are severe or illness seems like a threat to life.  It is vitally important that if you feel that you have an infection such as this virus or any other virus that you stay home and away from places where you may spread it to others.  You should avoid contact with people age 63 and older.   You can use medication such as A prescription cough medication called Tessalon Perles 100 mg. You may take 1-2 capsules every 8 hours as needed for cough, A prescription inhaler called Albuterol MDI 90 mcg /actuation 2 puffs every 4 hours as needed for shortness of breath, wheezing, cough and I have prescribed Fluticasone nasal spray 2 sprays in each nostril one time per day.  I have also sent in dexamethasone 6 mg daily for 5 days.  You may  also take acetaminophen (Tylenol) as needed for fever.  Reduce your risk of any infection by using the same precautions used for avoiding  the common cold or flu:  Marland Kitchen Wash your hands often with soap and warm water for at least 20 seconds.  If soap and water are not readily available, use an alcohol-based hand sanitizer with at least 60% alcohol.  . If coughing or sneezing, cover your mouth and nose by coughing or sneezing into the elbow areas of your shirt or coat, into a tissue or into your sleeve (not your hands). . Avoid shaking hands with others and consider head nods or verbal greetings only. . Avoid touching your eyes, nose, or mouth with unwashed hands.  . Avoid close contact with people who are sick. . Avoid places or events with large numbers of people in one location, like concerts or sporting events. . Carefully consider travel plans you have or are making. . If you are planning any travel outside or inside the [place], visit the CDC's Travelers' Health webpage for the latest health notices. . If you have some symptoms but not all symptoms, continue to monitor at home and seek medical attention if your symptoms worsen. . If you are having a medical emergency, call 911.  HOME CARE . Only take medications as instructed by your medical team. . Drink plenty of fluids and get plenty of rest. . A steam or ultrasonic humidifier can help if you have congestion.   GET HELP RIGHT AWAY IF YOU HAVE EMERGENCY WARNING SIGNS** FOR COVID-19. If you or someone is showing any of these signs seek emergency medical care immediately. Call 911 or proceed to your closest emergency facility if: . You develop worsening high fever. . Trouble breathing . Bluish lips or face . Persistent pain or pressure in the chest . New confusion . Inability to wake or stay awake . You cough up blood. . Your symptoms become more severe  **This list is not all possible symptoms. Contact your medical provider for any symptoms that are sever or concerning to you.  MAKE SURE YOU   Understand these instructions.  Will watch your condition.  Will get help  right away if you are not doing well or get worse.  Your e-visit answers were reviewed by a board certified advanced clinical practitioner to complete your personal care plan.  Depending on the condition, your plan could have included both over the counter or prescription medications.  If there is a problem please reply once you have received a response from your provider.  Your safety is important to [place].  If you have drug allergies check your prescription carefully.    You can use MyChart to ask questions about today's visit, request a non-urgent call back, or ask for a work or school excuse for 24 hours related to this e-Visit. If it has been greater than 24 hours you will need to follow up with your provider, or enter a new e-Visit to address those concerns. You will get an e-mail in the next two days asking about your experience.  I hope that your e-visit has been valuable and will speed your recovery. Thank you for using e-visits.   Approximately 5 minutes was spent documenting and reviewing patient's chart.

## 2020-02-05 ENCOUNTER — Ambulatory Visit (INDEPENDENT_AMBULATORY_CARE_PROVIDER_SITE_OTHER): Payer: 59 | Admitting: Family

## 2020-02-05 ENCOUNTER — Other Ambulatory Visit: Payer: Self-pay

## 2020-02-05 ENCOUNTER — Encounter: Payer: Self-pay | Admitting: Family

## 2020-02-05 ENCOUNTER — Ambulatory Visit: Payer: 59 | Admitting: Family

## 2020-02-05 VITALS — BP 130/82 | HR 99 | Temp 98.1°F | Ht 74.0 in | Wt 246.0 lb

## 2020-02-05 DIAGNOSIS — Z13220 Encounter for screening for lipoid disorders: Secondary | ICD-10-CM | POA: Diagnosis not present

## 2020-02-05 DIAGNOSIS — Z0000 Encounter for general adult medical examination without abnormal findings: Secondary | ICD-10-CM

## 2020-02-05 DIAGNOSIS — Z113 Encounter for screening for infections with a predominantly sexual mode of transmission: Secondary | ICD-10-CM | POA: Diagnosis not present

## 2020-02-05 LAB — CBC WITH DIFFERENTIAL/PLATELET
Basophils Absolute: 0 10*3/uL (ref 0.0–0.1)
Basophils Relative: 0.3 % (ref 0.0–3.0)
Eosinophils Absolute: 0 10*3/uL (ref 0.0–0.7)
Eosinophils Relative: 0.4 % (ref 0.0–5.0)
HCT: 45.4 % (ref 39.0–52.0)
Hemoglobin: 15.1 g/dL (ref 13.0–17.0)
Lymphocytes Relative: 30.1 % (ref 12.0–46.0)
Lymphs Abs: 2.4 10*3/uL (ref 0.7–4.0)
MCHC: 33.3 g/dL (ref 30.0–36.0)
MCV: 92.8 fl (ref 78.0–100.0)
Monocytes Absolute: 0.8 10*3/uL (ref 0.1–1.0)
Monocytes Relative: 9.7 % (ref 3.0–12.0)
Neutro Abs: 4.7 10*3/uL (ref 1.4–7.7)
Neutrophils Relative %: 59.5 % (ref 43.0–77.0)
Platelets: 285 10*3/uL (ref 150.0–400.0)
RBC: 4.89 Mil/uL (ref 4.22–5.81)
RDW: 12.9 % (ref 11.5–15.5)
WBC: 7.9 10*3/uL (ref 4.0–10.5)

## 2020-02-05 LAB — COMPREHENSIVE METABOLIC PANEL
ALT: 24 U/L (ref 0–53)
AST: 23 U/L (ref 0–37)
Albumin: 4.8 g/dL (ref 3.5–5.2)
Alkaline Phosphatase: 57 U/L (ref 39–117)
BUN: 20 mg/dL (ref 6–23)
CO2: 31 mEq/L (ref 19–32)
Calcium: 9.6 mg/dL (ref 8.4–10.5)
Chloride: 102 mEq/L (ref 96–112)
Creatinine, Ser: 1.26 mg/dL (ref 0.40–1.50)
GFR: 73.01 mL/min (ref >60.00)
Glucose, Bld: 102 mg/dL — ABNORMAL HIGH (ref 70–99)
Potassium: 3.6 mEq/L (ref 3.5–5.1)
Sodium: 138 mEq/L (ref 135–145)
Total Bilirubin: 0.5 mg/dL (ref 0.2–1.2)
Total Protein: 7.9 g/dL (ref 6.0–8.3)

## 2020-02-05 LAB — LIPID PANEL
Cholesterol: 191 mg/dL (ref 0–200)
HDL: 41.6 mg/dL (ref >39.00)
LDL Cholesterol: 118 mg/dL — ABNORMAL HIGH (ref 0–99)
NonHDL: 149.41
Total CHOL/HDL Ratio: 5
Triglycerides: 158 mg/dL — ABNORMAL HIGH (ref 0.0–149.0)
VLDL: 31.6 mg/dL (ref 0.0–40.0)

## 2020-02-05 LAB — TSH: TSH: 0.64 u[IU]/mL (ref 0.35–4.50)

## 2020-02-05 NOTE — Progress Notes (Signed)
Johnny Yang is a 39 y.o. male with the following history as recorded in EpicCare:  Patient Active Problem List   Diagnosis Date Noted  . Gasping for breath 01/31/2019  . PTSD (post-traumatic stress disorder) 01/31/2019  . Snoring 01/31/2019  . Circadian rhythm sleep disorder, 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  . Excessive daytime sleepiness 01/31/2019  . Sore throat 02/08/2018  . 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 Allergic conjunctivitis 08/18/2017  . 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 Testicular pain 05/28/2015  . ANXIETY STATE, UNSPECIFIED 10/31/2008  . Seasonal and perennial allergic rhinitis 10/31/2008  . Migraine equivalent 10/31/2008    Current Outpatient Medications  Medication Sig Dispense Refill  . albuterol (VENTOLIN HFA) 108 (90 Base) MCG/ACT inhaler Inhale 2 puffs into the lungs every 6 (six) hours as needed for wheezing or shortness of breath. 8 g 0  . cetirizine (ZYRTEC) 10 MG tablet Take 10 mg by mouth daily.     No current facility-administered medications for this visit.    Allergies: Patient has no known allergies.  Past Medical History:  Diagnosis Date  . Allergy    seasonal  . Urticaria     Past Surgical History:  Procedure Laterality Date  . FOOT SURGERY Left   . TONSILLECTOMY AND ADENOIDECTOMY      Family History  Problem Relation Age of Onset  . Cancer Paternal Aunt        ovarian  . Allergic rhinitis Mother   . Allergic rhinitis Father   . Asthma Neg Hx   . Angioedema Neg Hx   . Eczema Neg Hx   . Immunodeficiency Neg Hx   . Urticaria Neg Hx     Social History   Tobacco Use  . Smoking status: Light Tobacco Smoker    Types: Cigars  . Smokeless tobacco: Never Used  . Tobacco comment: 2x per month- cigar  Substance Use Topics  . Alcohol use: Yes    Comment:  occasionally as 2-3 beverages 2-3 X/ week    Subjective:  Presents for yearly CPE; in baseline state of health today; planning to get his COVID vaccine soon; requesting STD  screen today;  Defers flu shot today;  Planning to get back on track with his exercise routine; felt like COVID derailed his activities;   Review of Systems  Constitutional: Negative.   HENT: Negative.   Eyes: Negative.   Respiratory: Negative.   Cardiovascular: Negative.   Gastrointestinal: Negative.   Genitourinary: Negative.   Musculoskeletal: Negative.   Skin: Negative.   Neurological: Negative.   Endo/Heme/Allergies: Negative.   Psychiatric/Behavioral: Negative.        Objective:  Vitals:   02/05/20 1013  BP: 130/82  Pulse: 99  Temp: 98.1 F (36.7 C)  TempSrc: Oral  SpO2: 98%  Weight: 246 lb (111.6 kg)  Height: _0  (1.88 m)    General: Well developed, well nourished, in no acute distress  Skin : Warm and dry.  Head: Normocephalic and atraumatic  Eyes: Sclera and conjunctiva clear; pupils round and reactive to light; extraocular movements intact  Ears: External normal; canals clear; tympanic membranes normal  Oropharynx: Pink, supple. No suspicious lesions  Neck: Supple without thyromegaly, adenopathy  Lungs: Respirations unlabored; clear to auscultation bilaterally without wheeze, rales, rhonchi  CVS exam: normal rate and regular rhythm.  Abdomen: Soft; nontender; nondistended; normoactive bowel sounds; no masses or hepatosplenomegaly  Musculoskeletal: No deformities; no active joint inflammation  Extremities: No edema, cyanosis, clubbing  Vessels: Symmetric bilaterally  Neurologic: Alert and oriented; speech intact; face symmetrical; moves all extremities well; CNII-XII intact without focal deficit   Assessment:  1. PE (physical exam), annual   2. Lipid screening   3. Screen for STD (sexually transmitted disease)     Plan:  Age appropriate preventive healthcare needs addressed; encouraged regular eye doctor and dental exams; encouraged regular exercise and weight loss; will update labs and refills as needed today; follow-up to be determined;  This visit  occurred during the SARS-CoV-2 public health emergency.  Safety protocols were in place, including screening questions prior to the visit, additional usage of staff PPE, and extensive cleaning of exam room while observing appropriate contact time as indicated for disinfecting solutions.   Influenza immunization was not given due to patient refusal.   No follow-ups on file.  Orders Placed This Encounter  Procedures  . GC/Chlamydia Probe Amp([facility])    Standing Status:   Future    Number of Occurrences:   1    Standing Expiration Date:   02/04/2021  . CBC with Differential/Platelet    Standing Status:   Future    Number of Occurrences:   1    Standing Expiration Date:   02/04/2021  . Comp Met (CMET)    Standing Status:   Future    Number of Occurrences:   1    Standing Expiration Date:   02/04/2021  . Lipid panel    Standing Status:   Future    Number of Occurrences:   1    Standing Expiration Date:   02/04/2021  . TSH    Standing Status:   Future    Number of Occurrences:   1    Standing Expiration Date:   02/04/2021  . HIV Antibody (routine testing w rflx)    Standing Status:   Future    Number of Occurrences:   1    Standing Expiration Date:   02/04/2021  . RPR    Standing Status:   Future    Number of Occurrences:   1    Standing Expiration Date:   02/04/2021    Requested Prescriptions    No prescriptions requested or ordered in this encounter

## 2020-02-06 LAB — RPR: RPR Ser Ql: NONREACTIVE

## 2020-02-06 LAB — HIV ANTIBODY (ROUTINE TESTING W REFLEX): HIV 1&2 Ab, 4th Generation: NONREACTIVE

## 2020-02-09 LAB — GC/CHLAMYDIA PROBE AMP
Chlamydia trachomatis, NAA: NEGATIVE
Neisseria Gonorrhoeae by PCR: NEGATIVE

## 2020-02-18 ENCOUNTER — Ambulatory Visit: Payer: 59 | Attending: Internal Medicine

## 2020-02-18 DIAGNOSIS — Z23 Encounter for immunization: Secondary | ICD-10-CM

## 2020-02-18 NOTE — Progress Notes (Signed)
   Covid-19 Vaccination Clinic  Name:  Johnny Yang    MRN: [id number] DOB: 26-Oct-1980  02/18/2020  Johnny Yang was observed post Covid-19 immunization for 15 minutes without incident. He was provided with Vaccine Information Sheet and instruction to access the V-Safe system.   Johnny Yang was instructed to call 911 with any severe reactions post vaccine: Marland Kitchen Difficulty breathing  . Swelling of face and throat  . A fast heartbeat  . A bad rash all over body  . Dizziness and weakness   Immunizations Administered    Name Date Dose VIS Date Route   Pfizer COVID-19 Vaccine 02/18/2020  4:24 PM 0.3 mL 12/18/2019 Intramuscular   Manufacturer: [employer], [employer]   Lot: [id number]   NDC: [address]-1

## 2020-08-18 ENCOUNTER — Encounter: Payer: Self-pay | Admitting: Internal Medicine

## 2020-08-18 ENCOUNTER — Ambulatory Visit: Payer: 59 | Admitting: Internal Medicine

## 2020-08-18 ENCOUNTER — Other Ambulatory Visit: Payer: Self-pay

## 2020-08-18 VITALS — BP 128/92 | HR 93 | Temp 98.1°F | Resp 16 | Ht 74.0 in | Wt 243.4 lb

## 2020-08-18 DIAGNOSIS — M791 Myalgia, unspecified site: Secondary | ICD-10-CM

## 2020-08-18 DIAGNOSIS — R221 Localized swelling, mass and lump, neck: Secondary | ICD-10-CM | POA: Diagnosis not present

## 2020-08-18 DIAGNOSIS — G4733 Obstructive sleep apnea (adult) (pediatric): Secondary | ICD-10-CM | POA: Diagnosis not present

## 2020-08-18 DIAGNOSIS — E860 Dehydration: Secondary | ICD-10-CM

## 2020-08-18 NOTE — Patient Instructions (Signed)
Drink plenty of fluids particularly in your work outdoors  We are referring you today ENT doctor to see about that lump on your neck.  They should be calling you within few days   GO TO THE LAB : Get the blood work

## 2020-08-18 NOTE — Progress Notes (Signed)
Subjective:    Patient ID: Johnny Yang, male    DOB: 24-Oct-1980, 40 y.o.   MRN: [id number]  DOS:  08/18/2020 Type of visit - description: Acute  5 days ago he did work outdoors, he was very hot, subsequently he developed a number of symptoms: Headache. Muscle aches and cramps Very fatigue and sleepy.  As of now, headache is resolved, muscle aches are somewhat better. I ask if he drank enough water last week and he thought so.  Also, 4 weeks ago noted lump at the right side of the neck, is getting slightly larger he thinks. The area rubs against his equipment, and is bothering him.   Review of Systems No fever chills No chest pain Had some palpitations when he worked outdoors last week. No nausea, vomiting, diarrhea. No LOC  Past Medical History:  Diagnosis Date   Allergy    seasonal   Urticaria     Past Surgical History:  Procedure Laterality Date   FOOT SURGERY Left    TONSILLECTOMY AND ADENOIDECTOMY      Allergies as of 08/18/2020   No Known Allergies      Medication List        Accurate as of August 18, 2020  1:45 PM. If you have any questions, ask your nurse or doctor.          albuterol 108 (90 Base) MCG/ACT inhaler Commonly known as: VENTOLIN HFA Inhale 2 puffs into the lungs every 6 (six) hours as needed for wheezing or shortness of breath.   cetirizine 10 MG tablet Commonly known as: ZYRTEC Take 10 mg by mouth daily.           Objective:   Physical Exam Neck:    BP (!) 128/92 (BP Location: Left Arm, Patient Position: Sitting, Cuff Size: Normal)   Pulse 93   Temp 98.1 F (36.7 C) (Oral)   Resp 16   Ht 6\' 2"  (1.88 m)   Wt 243 lb 6 oz (110.4 kg)   SpO2 98%   BMI 31.25 kg/m  General:   Well developed, NAD, BMI noted.  HEENT:  Normocephalic . Face symmetric, atraumatic Lungs:  CTA B Normal respiratory effort, no intercostal retractions, no accessory muscle use. Heart: RRR,  no murmur.  Abdomen:  Not distended, soft,  non-tender. No rebound or rigidity.   Skin: Not pale. Not jaundice Lower extremities: no pretibial edema bilaterally  Neurologic:  alert & oriented X3.  Speech normal, gait appropriate for age and unassisted Psych--  Cognition and judgment appear intact.  Cooperative with normal attention span and concentration.  Behavior appropriate. No anxious or depressed appearing.     Assessment     41 year old gentleman, PMH includes anxiety, migraine equivalent, allergies, PTSD, snoring, Sleep study 01/2019 consistent with a sleep apnea, Rx CPAP (not using it) presents with:  Dehydration: Suspect headache, muscle cramps, fatigue is due to dehydration from working outdoors in a very hot day. He is nontoxic-appearing, for completeness we will check CMP, CBC and total CK. Recommend good hydration now particularly when he works outdoors  Sebaceous cyst: Suspect lump on the right side of the neck is a sebaceous cyst, the spot rubs with his 03/2019.  He would like it excised, I think that is reasonable, refer to ENT  OSA: Although his recent fatigue is likely acute and related to dehydration, reminded the patient he has a sleep apnea and recommend to be treated.  PHQ-9: It is elevated, he reports  that most of his score is coming from recent physical sxs not emotional ones.  This visit occurred during the SARS-CoV-2 public health emergency.  Safety protocols were in place, including screening questions prior to the visit, additional usage of staff PPE, and extensive cleaning of exam room while observing appropriate contact time as indicated for disinfecting solutions.

## 2020-08-19 LAB — CBC WITH DIFFERENTIAL/PLATELET
Basophils Absolute: 0.1 10*3/uL (ref 0.0–0.1)
Basophils Relative: 0.7 % (ref 0.0–3.0)
Eosinophils Absolute: 0.1 10*3/uL (ref 0.0–0.7)
Eosinophils Relative: 0.8 % (ref 0.0–5.0)
HCT: 43.4 % (ref 39.0–52.0)
Hemoglobin: 14.9 g/dL (ref 13.0–17.0)
Lymphocytes Relative: 28.1 % (ref 12.0–46.0)
Lymphs Abs: 2.6 10*3/uL (ref 0.7–4.0)
MCHC: 34.3 g/dL (ref 30.0–36.0)
MCV: 92.7 fl (ref 78.0–100.0)
Monocytes Absolute: 0.8 10*3/uL (ref 0.1–1.0)
Monocytes Relative: 8.5 % (ref 3.0–12.0)
Neutro Abs: 5.6 10*3/uL (ref 1.4–7.7)
Neutrophils Relative %: 61.9 % (ref 43.0–77.0)
Platelets: 270 10*3/uL (ref 150.0–400.0)
RBC: 4.68 Mil/uL (ref 4.22–5.81)
RDW: 12.8 % (ref 11.5–15.5)
WBC: 9.1 10*3/uL (ref 4.0–10.5)

## 2020-08-19 LAB — COMPREHENSIVE METABOLIC PANEL
ALT: 20 U/L (ref 0–53)
AST: 19 U/L (ref 0–37)
Albumin: 4.3 g/dL (ref 3.5–5.2)
Alkaline Phosphatase: 62 U/L (ref 39–117)
BUN: 12 mg/dL (ref 6–23)
CO2: 29 mEq/L (ref 19–32)
Calcium: 9.4 mg/dL (ref 8.4–10.5)
Chloride: 104 mEq/L (ref 96–112)
Creatinine, Ser: 1.17 mg/dL (ref 0.40–1.50)
GFR: 79.5 mL/min (ref >60.00)
Glucose, Bld: 104 mg/dL — ABNORMAL HIGH (ref 70–99)
Potassium: 4 mEq/L (ref 3.5–5.1)
Sodium: 140 mEq/L (ref 135–145)
Total Bilirubin: 0.3 mg/dL (ref 0.2–1.2)
Total Protein: 6.8 g/dL (ref 6.0–8.3)

## 2020-08-19 LAB — CK: Total CK: 266 U/L — ABNORMAL HIGH (ref 7–232)

## 2020-09-28 ENCOUNTER — Ambulatory Visit (INDEPENDENT_AMBULATORY_CARE_PROVIDER_SITE_OTHER): Payer: 59 | Admitting: Otolaryngology

## 2020-09-28 ENCOUNTER — Other Ambulatory Visit: Payer: Self-pay

## 2020-09-28 DIAGNOSIS — R221 Localized swelling, mass and lump, neck: Secondary | ICD-10-CM

## 2020-09-28 NOTE — Progress Notes (Signed)
HPI: Johnny Yang is a 40 y.o. male who presents is referred by his PCP Dr. Drue Novel For evaluation of posterior right neck nodule.  Patient first noticed this about 5 months ago and it has gradually gotten larger.. It causes intermittent discomfort along when he works and wears near that presses on the area. He would like to have it removed. He is otherwise healthy.  Past Medical History:  Diagnosis Date   Allergy    seasonal   Urticaria    Past Surgical History:  Procedure Laterality Date   FOOT SURGERY Left    TONSILLECTOMY AND ADENOIDECTOMY     Social History   Socioeconomic History   Marital status: Married    Spouse name: Not on file   Number of children: 1   Years of education: 12   Highest education level: Not on file  Occupational History   Not on file  Tobacco Use   Smoking status: Light Smoker    Types: Cigars   Smokeless tobacco: Never   Tobacco comments:    2x per month- cigar  Vaping Use   Vaping Use: Never used  Substance and Sexual Activity   Alcohol use: Yes    Comment:  occasionally as 2-3 beverages 2-3 X/ week   Drug use: No   Sexual activity: Not on file  Other Topics Concern   Not on file  Social History Narrative   Lives with family   Caffeine- not daily   Social Determinants of Health   Financial Resource Strain: Not on file  Food Insecurity: Not on file  Transportation Needs: Not on file  Physical Activity: Not on file  Stress: Not on file  Social Connections: Not on file   Family History  Problem Relation Age of Onset   Cancer Paternal Aunt        ovarian   Allergic rhinitis Mother    Allergic rhinitis Father    Asthma Neg Hx    Angioedema Neg Hx    Eczema Neg Hx    Immunodeficiency Neg Hx    Urticaria Neg Hx    No Known Allergies Prior to Admission medications   Medication Sig Start Date End Date Taking? Authorizing Provider  albuterol (VENTOLIN HFA) 108 (90 Base) MCG/ACT inhaler Inhale 2 puffs into the lungs every 6  (six) hours as needed for wheezing or shortness of breath. Patient not taking: Reported on 08/18/2020 01/05/20   Junie Spencer, FNP  cetirizine (ZYRTEC) 10 MG tablet Take 10 mg by mouth daily.    [provider]     Positive ROS: Otherwise negative  All other systems have been reviewed and were otherwise negative with the exception of those mentioned in the HPI and as above.  Physical Exam: Constitutional: Alert, well-appearing, no acute distress Ears: External ears without lesions or tenderness. Ear canals are clear bilaterally with intact, clear TMs.  Nasal: External nose without lesions. Septum with minimal deformity and mild rhinitis.. Clear nasal passages Oral: Lips and gums without lesions. Tongue and palate mucosa without lesions. Posterior oropharynx clear.  Patient is status post tonsillectomy. Neck: No palpable adenopathy or masses in the anterior neck.  On evaluation of the posterior neck he has a 1 and half centimeter firm subcutaneous nodule just below the hairline.  No swelling or erythema noted around the nodule.  Question whether this represents a lymph node versus a dermoid cyst. Respiratory: Breathing comfortably Cardiac exam: Regular rate and rhythm without murmur. Skin: No facial/neck lesions or  rash noted.  Procedures  Assessment: Posterior right neck nodule that is because the patient discomfort and has slowly enlarged.  Plan: Discussed excision of this with the patient in the office today and we will plan on scheduling this at his convenience in the next 3 to 4 weeks under local anesthetic.   Narda Bonds, MD   CC:

## 2020-10-15 DIAGNOSIS — R221 Localized swelling, mass and lump, neck: Secondary | ICD-10-CM

## 2020-10-19 ENCOUNTER — Ambulatory Visit (INDEPENDENT_AMBULATORY_CARE_PROVIDER_SITE_OTHER): Payer: 59 | Admitting: Otolaryngology

## 2020-10-19 ENCOUNTER — Other Ambulatory Visit: Payer: Self-pay

## 2020-10-19 DIAGNOSIS — Z4889 Encounter for other specified surgical aftercare: Secondary | ICD-10-CM

## 2020-10-19 NOTE — Progress Notes (Signed)
HPI: Johnny Yang is a 40 y.o. male who presents 4 days s/p excision of posterior neck cyst.  The final pathology report is still pending today.  He is doing well otherwise complaining of some mild itching in the area but no pain or discomfort..   Past Medical History:  Diagnosis Date   Allergy    seasonal   Urticaria    Past Surgical History:  Procedure Laterality Date   FOOT SURGERY Left    TONSILLECTOMY AND ADENOIDECTOMY     Social History   Socioeconomic History   Marital status: Married    Spouse name: Not on file   Number of children: 1   Years of education: 12   Highest education level: Not on file  Occupational History   Not on file  Tobacco Use   Smoking status: Light Smoker    Types: Cigars   Smokeless tobacco: Never   Tobacco comments:    2x per month- cigar  Vaping Use   Vaping Use: Never used  Substance and Sexual Activity   Alcohol use: Yes    Comment:  occasionally as 2-3 beverages 2-3 X/ week   Drug use: No   Sexual activity: Not on file  Other Topics Concern   Not on file  Social History Narrative   Lives with family   Caffeine- not daily   Social Determinants of Health   Financial Resource Strain: Not on file  Food Insecurity: Not on file  Transportation Needs: Not on file  Physical Activity: Not on file  Stress: Not on file  Social Connections: Not on file   Family History  Problem Relation Age of Onset   Cancer Paternal Aunt        ovarian   Allergic rhinitis Mother    Allergic rhinitis Father    Asthma Neg Hx    Angioedema Neg Hx    Eczema Neg Hx    Immunodeficiency Neg Hx    Urticaria Neg Hx    No Known Allergies Prior to Admission medications   Medication Sig Start Date End Date Taking? Authorizing Provider  albuterol (VENTOLIN HFA) 108 (90 Base) MCG/ACT inhaler Inhale 2 puffs into the lungs every 6 (six) hours as needed for wheezing or shortness of breath. Patient not taking: Reported on 08/18/2020 01/05/20   Junie Spencer, FNP  cetirizine (ZYRTEC) 10 MG tablet Take 10 mg by mouth daily.    [provider]     Physical Exam: The excision site is healing nicely with no swelling or erythema.   Assessment: S/p excision of right posterior neck cyst consistent with probable sebaceous cyst.  Final pathology is still pending.  Plan: Patient will call us back in 3-7 days concerning path report.   Narda Bonds, MD

## 2020-10-22 ENCOUNTER — Telehealth (INDEPENDENT_AMBULATORY_CARE_PROVIDER_SITE_OTHER): Payer: Self-pay | Admitting: Otolaryngology

## 2020-10-22 NOTE — Telephone Encounter (Signed)
Called Johnny Yang today concerning results of his neck excision.  This showed his giant cell reaction and fibroadipose tissue consistent with inflammatory response to a ruptured cyst.  This is benign.  He is doing well.  He will follow-up as needed.

## 2020-12-04 ENCOUNTER — Encounter: Payer: Self-pay | Admitting: Physician Assistant

## 2020-12-04 ENCOUNTER — Telehealth: Payer: 59 | Admitting: Physician Assistant

## 2020-12-04 DIAGNOSIS — J019 Acute sinusitis, unspecified: Secondary | ICD-10-CM

## 2020-12-04 DIAGNOSIS — B9789 Other viral agents as the cause of diseases classified elsewhere: Secondary | ICD-10-CM

## 2020-12-04 MED ORDER — BENZONATATE 100 MG PO CAPS: 100.0000 mg | ORAL_CAPSULE | Freq: Three times a day (TID) | ORAL | 0 refills | Status: DC | PRN

## 2020-12-04 MED ORDER — IPRATROPIUM BROMIDE 0.03 % NA SOLN: 2.0000 | Freq: Two times a day (BID) | NASAL | 12 refills | Status: DC

## 2020-12-04 NOTE — Progress Notes (Signed)
Virtual Visit Consent   Johnny Yang, you are scheduled for a virtual visit with a [facility] provider today.     Just as with appointments in the office, your consent must be obtained to participate.  Your consent will be active for this visit and any virtual visit you may have with one of our providers in the next 365 days.     If you have a MyChart account, a copy of this consent can be sent to you electronically.  All virtual visits are billed to your insurance company just like a traditional visit in the office.    As this is a virtual visit, video technology does not allow for your provider to perform a traditional examination.  This may limit your provider's ability to fully assess your condition.  If your provider identifies any concerns that need to be evaluated in person or the need to arrange testing (such as labs, EKG, etc.), we will make arrangements to do so.     Although advances in technology are sophisticated, we cannot ensure that it will always work on either your end or our end.  If the connection with a video visit is poor, the visit may have to be switched to a telephone visit.  With either a video or telephone visit, we are not always able to ensure that we have a secure connection.     I need to obtain your verbal consent now.   Are you willing to proceed with your visit today?    Johnny Yang has provided verbal consent on 12/04/2020 for a virtual visit (video or telephone).   Margaretann Loveless, PA-C   Date: 12/04/2020 3:15 PM   Virtual Visit via Video Note   I, Margaretann Loveless, connected with  Johnny Yang  ([id number], 11/06/1980) on 12/04/20 at  3:00 PM EDT by a video-enabled telemedicine application and verified that I am speaking with the correct person using two identifiers.  Location: Patient: Virtual Visit Location Patient: Home Provider: Virtual Visit Location Provider: Home Office   I discussed the limitations of evaluation and  management by telemedicine and the availability of in person appointments. The patient expressed understanding and agreed to proceed.    History of Present Illness: Johnny Yang is a 40 y.o. who identifies as a male who was assigned male at birth, and is being seen today for seasonal allergies. Noticed a couple days ago having increased rhinorrhea, nasal congestion, and body aches. Some chills at night. Not sleeping well. Does have sneezing and cough. Cough is productive of white sputum. Pressure in head and ears. Denies fever, sore throat, nausea, vomiting, or diarrhea. Has been taking normal [facility] generic antihistamine, ibuprofen and cough syrup. He has not had Covid 19 testing, he does work at [employer].     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   Excessive daytime sleepiness 01/31/2019   Sore throat 02/08/2018   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   Allergic conjunctivitis 08/18/2017   Hallux limitus of right foot 01/19/2016   Testicular pain 05/28/2015   ANXIETY STATE, UNSPECIFIED 10/31/2008   Seasonal and perennial allergic rhinitis 10/31/2008   Migraine equivalent 10/31/2008    Allergies: No Known Allergies Medications:  Current Outpatient Medications:    benzonatate (TESSALON) 100 MG capsule, Take 1  capsule (100 mg total) by mouth 3 (three) times daily as needed., Disp: 30 capsule, Rfl: 0   ipratropium (ATROVENT) 0.03 % nasal spray, Place 2 sprays into both nostrils every 12 (twelve) hours., Disp: 30 mL, Rfl: 12   albuterol (VENTOLIN HFA) 108 (90 Base) MCG/ACT inhaler, Inhale 2 puffs into the lungs every 6 (six) hours as needed for wheezing or shortness of breath. (Patient not taking: Reported on 08/18/2020), Disp: 8 g, Rfl: 0   cetirizine (ZYRTEC) 10 MG tablet, Take 10 mg by  mouth daily., Disp: , Rfl:   Observations/Objective: Patient is well-developed, well-nourished in no acute distress.  Resting comfortably at home.  Head is normocephalic, atraumatic.  No labored breathing.  Speech is clear and coherent with logical content.  Patient is alert and oriented at baseline.  Nasal congestion heard  Assessment and Plan: 1. Acute viral sinusitis - benzonatate (TESSALON) 100 MG capsule; Take 1 capsule (100 mg total) by mouth 3 (three) times daily as needed.  Dispense: 30 capsule; Refill: 0 - ipratropium (ATROVENT) 0.03 % nasal spray; Place 2 sprays into both nostrils every 12 (twelve) hours.  Dispense: 30 mL; Refill: 12  - Suspect allergy exacerbation vs viral sinusitis vs covid - Advised to take at home covid test  - Continue symptomatic management OTC - Added tessalon and ipratropium nasal spray - Saline nasal rinses - Push fluids - Rest as needed - MyChart message sent to await covid testing results - Work note provided for this evening while he undergoes covid testing; can extend if covid positive - Seek in person evaluation if symptoms worsen or fail to improve  Follow Up Instructions: I discussed the assessment and treatment plan with the patient. The patient was provided an opportunity to ask questions and all were answered. The patient agreed with the plan and demonstrated an understanding of the instructions.  A copy of instructions were sent to the patient via MyChart unless otherwise noted below.    The patient was advised to call back or seek an in-person evaluation if the symptoms worsen or if the condition fails to improve as anticipated.  Time:  I spent 10 minutes with the patient via telehealth technology discussing the above problems/concerns.    Margaretann Loveless, PA-C

## 2020-12-04 NOTE — Patient Instructions (Signed)
Johnny Yang, thank you for joining Margaretann Loveless, PA-C for today's virtual visit.  While this provider is not your primary care provider (PCP), if your PCP is located in our provider database this encounter information will be shared with them immediately following your visit.  Consent: (Patient) Johnny Yang provided verbal consent for this virtual visit at the beginning of the encounter.  Current Medications:  Current Outpatient Medications:    benzonatate (TESSALON) 100 MG capsule, Take 1 capsule (100 mg total) by mouth 3 (three) times daily as needed., Disp: 30 capsule, Rfl: 0   ipratropium (ATROVENT) 0.03 % nasal spray, Place 2 sprays into both nostrils every 12 (twelve) hours., Disp: 30 mL, Rfl: 12   albuterol (VENTOLIN HFA) 108 (90 Base) MCG/ACT inhaler, Inhale 2 puffs into the lungs every 6 (six) hours as needed for wheezing or shortness of breath. (Patient not taking: Reported on 08/18/2020), Disp: 8 g, Rfl: 0   cetirizine (ZYRTEC) 10 MG tablet, Take 10 mg by mouth daily., Disp: , Rfl:    Medications ordered in this encounter:  Meds ordered this encounter  Medications   benzonatate (TESSALON) 100 MG capsule    Sig: Take 1 capsule (100 mg total) by mouth 3 (three) times daily as needed.    Dispense:  30 capsule    Refill:  0    Order Specific Question:   Supervising Provider    Answer:   MILLER, BRIAN [[id number]]   ipratropium (ATROVENT) 0.03 % nasal spray    Sig: Place 2 sprays into both nostrils every 12 (twelve) hours.    Dispense:  30 mL    Refill:  12    Order Specific Question:   Supervising Provider    Answer:   Eber Hong [[id number]]     *If you need refills on other medications prior to your next appointment, please contact your pharmacy*  Follow-Up: Call back or seek an in-person evaluation if the symptoms worsen or if the condition fails to improve as anticipated.  Other Instructions Allergies, Adult An allergy means that your body reacts to  something that bothers it (allergen). This can happen from something that you eat, breathe in, or touch. Allergies often affect the nose, eyes, skin, and stomach. They can be mild, moderate, or very bad (severe). An allergy cannot spread from person to person. They can happen at any age. Sometimes, people outgrow them. What are the causes? Outdoor things, such as pollen, car fumes, and mold. Indoor things, such as dust, smoke, mold, and pets. Foods. Medicines. Things that bother your skin, such as perfume and bug bites. What increases the risk? Having family members with allergies or asthma. What are the signs or symptoms? Symptoms depend on how bad your allergy is. Mild to moderate symptoms Runny nose, stuffy nose, or sneezing. Itchy mouth, ears, or throat. A feeling of mucus dripping down the back of your throat. Sore throat. Eyes that are itchy, red, watery, or puffy. A skin rash, or red, swollen areas of skin (hives). Stomach cramps or bloating. Severe symptoms Very bad allergies to food, medicine, or bug bites may cause a very bad allergy reaction (anaphylaxis). This can be life-threatening. Symptoms include: A red face. Wheezing or coughing. Swollen lips, tongue, or mouth. Tight or swollen throat. Chest pain or tightness, or a fast heartbeat. Trouble breathing or shortness of breath. Pain in your belly (abdomen), vomiting, or watery poop (diarrhea). Feeling dizzy or fainting. How is this treated?   Treatment for this  condition depends on your symptoms. Treatment may include: Cold, wet cloths for itching and swelling. Eye drops, nose sprays, or skin creams. Washing out your nose each day. A humidifier. Medicines. A change to the foods you eat. Being exposed again and again to tiny amounts of allergens. This helps your body get used to them. You might have: Allergy shots. Very small amounts of allergen put under your tongue. An emergency shot (auto-injector pen) if you  have a very bad allergy reaction. This is a medicine with a needle. You can put it into your skin by yourself. Your doctor will teach you how to use it. Follow these instructions at home: Medicines  Take or apply over-the-counter and prescription medicines only as told by your doctor. If you are at risk for a very bad allergy reaction, keep an auto-injector pen with you all the time. Eating and drinking Follow instructions from your doctor about what to eat and drink. Drink enough fluid to keep your pee (urine) pale yellow. General instructions If you have ever had a very bad allergy reaction, wear a medical alert bracelet or necklace. Stay away from things that you are allergic to. Keep all follow-up visits as told by your doctor. This is important. Contact a doctor if: Your symptoms do not get better with treatment. Get help right away if: You have symptoms of a very bad allergy reaction. These include: A swollen mouth, tongue, or throat. Pain or tightness in your chest. Trouble breathing. Being short of breath. Dizziness. Fainting. Very bad pain in your belly. Vomiting. Watery poop. These symptoms may be an emergency. Do not wait to see if the symptoms will go away. Get medical help right away. Call your local emergency services (911 in the [place] Do not drive yourself to the hospital. Summary Take or apply over-the-counter and prescription medicines only as told by your doctor. Stay away from things you are allergic to. If you are at risk for a very bad allergy reaction, carry an auto-injector pen all the time. Wear a medical alert bracelet or necklace. Very bad allergy reactions can be life-threatening. Get help right away. This information is not intended to replace advice given to you by your health care provider. Make sure you discuss any questions you have with your health care provider. Document Revised: 12/26/2018 Document Reviewed: 12/26/2018 Elsevier Patient  Education  2022 [employer].    If you have been instructed to have an in-person evaluation today at a local Urgent Care facility, please use the link below. It will take you to a list of all of our available [facility], including address, phone number and hours of operation. Please do not delay care.  [facility]  If you or a family member do not have a primary care provider, use the link below to schedule a visit and establish care. When you choose a [facility] primary care physician or advanced practice provider, you gain a long-term partner in health. Find a Primary Care Provider  Learn more about [facility]'s in-office and virtual care options: [facility] - Get Care Now

## 2021-03-05 ENCOUNTER — Ambulatory Visit (INDEPENDENT_AMBULATORY_CARE_PROVIDER_SITE_OTHER): Payer: 59

## 2021-03-05 ENCOUNTER — Ambulatory Visit: Payer: 59 | Admitting: Podiatry

## 2021-03-05 ENCOUNTER — Other Ambulatory Visit: Payer: Self-pay

## 2021-03-05 DIAGNOSIS — M205X9 Other deformities of toe(s) (acquired), unspecified foot: Secondary | ICD-10-CM

## 2021-03-05 DIAGNOSIS — M778 Other enthesopathies, not elsewhere classified: Secondary | ICD-10-CM

## 2021-03-05 DIAGNOSIS — M79672 Pain in left foot: Secondary | ICD-10-CM

## 2021-03-05 DIAGNOSIS — M7742 Metatarsalgia, left foot: Secondary | ICD-10-CM

## 2021-03-05 DIAGNOSIS — M7741 Metatarsalgia, right foot: Secondary | ICD-10-CM

## 2021-03-05 DIAGNOSIS — M79671 Pain in right foot: Secondary | ICD-10-CM

## 2021-03-05 NOTE — Patient Instructions (Signed)

## 2021-03-08 ENCOUNTER — Ambulatory Visit: Payer: 59 | Admitting: Podiatry

## 2021-03-08 NOTE — Progress Notes (Signed)
Subjective:   Patient ID: Johnny Yang, male   DOB: 41 y.o.   MRN: [id number]   HPI 41 year old male presents the office today for concerns of bilateral foot pain.  He states he has pain to the ball of his feet describes throbbing sensation.  States he stands on his feet for 12 hours a day at work which aggravates his symptoms.  The right and left are about the same in regards to the pain.  He states that his pain gets worse after he takes his shoes off at the end of the day.  States has been walking differently started to affect the knee as well.  No significant swelling.  Gets some occasional tingling as well.  No recent injury or any other concerns.  Has a history of right foot first metatarsal osteotomy in 2018.   Review of Systems  All other systems reviewed and are negative.  Past Medical History:  Diagnosis Date   Allergy    seasonal   Urticaria     Past Surgical History:  Procedure Laterality Date   FOOT SURGERY Left    TONSILLECTOMY AND ADENOIDECTOMY       Current Outpatient Medications:    albuterol (VENTOLIN HFA) 108 (90 Base) MCG/ACT inhaler, Inhale 2 puffs into the lungs every 6 (six) hours as needed for wheezing or shortness of breath. (Patient not taking: Reported on 08/18/2020), Disp: 8 g, Rfl: 0   benzonatate (TESSALON) 100 MG capsule, Take 1 capsule (100 mg total) by mouth 3 (three) times daily as needed., Disp: 30 capsule, Rfl: 0   cetirizine (ZYRTEC) 10 MG tablet, Take 10 mg by mouth daily., Disp: , Rfl:    ipratropium (ATROVENT) 0.03 % nasal spray, Place 2 sprays into both nostrils every 12 (twelve) hours., Disp: 30 mL, Rfl: 12  No Known Allergies       Objective:  Physical Exam  General: AAO x3, NAD  Dermatological: Skin is warm, dry and supple bilateral.  Scar from prior surgery well-healed.  There are no open sores, no preulcerative lesions, no rash or signs of infection present.  Vascular: Dorsalis Pedis artery and Posterior Tibial artery  pedal pulses are 2/4 bilateral with immedate capillary fill time.  There is no pain with calf compression, swelling, warmth, erythema.   Neruologic: Grossly intact via light touch bilateral.  Negative Tinel sign.  Musculoskeletal: Decreased range of motion of right first MPJ worse than left.  There is tenderness mostly on submetatarsal area bilaterally.  There is no area of pinpoint tenderness.  No significant edema today.  No palpable neuroma noted.  Muscular strength 5/5 in all groups tested bilateral.  Equinus present  Gait: Unassisted, Nonantalgic.       Assessment:   Metatarsalgia, hallux limitus bilaterally    Plan:  -Treatment options discussed including all alternatives, risks, and complications -Etiology of symptoms were discussed -X-rays were obtained and reviewed with the patient.  Arthritic changes present in the first MPJ right side worse than left.  No evidence of acute fracture. -Prescribed mobic. Discussed side effects of the medication and directed to stop if any are to occur and call the office.  -Metatarsal pads dispensed -Discussed exercises for equinus -Discussed orthotics as well.  He was measured for orthotics today.

## 2021-03-13 ENCOUNTER — Telehealth: Payer: 59 | Admitting: Nurse Practitioner

## 2021-03-13 DIAGNOSIS — M7742 Metatarsalgia, left foot: Secondary | ICD-10-CM

## 2021-03-13 DIAGNOSIS — M7741 Metatarsalgia, right foot: Secondary | ICD-10-CM

## 2021-03-13 MED ORDER — MELOXICAM 15 MG PO TABS: 15.0000 mg | ORAL_TABLET | Freq: Every day | ORAL | 0 refills | Status: DC

## 2021-03-13 NOTE — Progress Notes (Signed)
Virtual Visit Consent   Johnny Yang, you are scheduled for a virtual visit with a [facility] provider today.     Just as with appointments in the office, your consent must be obtained to participate.  Your consent will be active for this visit and any virtual visit you may have with one of our providers in the next 365 days.     If you have a MyChart account, a copy of this consent can be sent to you electronically.  All virtual visits are billed to your insurance company just like a traditional visit in the office.    As this is a virtual visit, video technology does not allow for your provider to perform a traditional examination.  This may limit your provider's ability to fully assess your condition.  If your provider identifies any concerns that need to be evaluated in person or the need to arrange testing (such as labs, EKG, etc.), we will make arrangements to do so.     Although advances in technology are sophisticated, we cannot ensure that it will always work on either your end or our end.  If the connection with a video visit is poor, the visit may have to be switched to a telephone visit.  With either a video or telephone visit, we are not always able to ensure that we have a secure connection.     I need to obtain your verbal consent now.   Are you willing to proceed with your visit today?    Johnny Yang has provided verbal consent on 03/13/2021 for a virtual visit (video or telephone).   Claiborne Rigg, NP   Date: 03/13/2021 2:51 PM   Virtual Visit via Video Note   I, Claiborne Rigg, connected with  Johnny Yang  ([id number], 12-23-1980) on 03/13/21 at  3:00 PM EST by a video-enabled telemedicine application and verified that I am speaking with the correct person using two identifiers.  Location: Patient: Virtual Visit Location Patient: Home Provider: Virtual Visit Location Provider: Home Office   I discussed the limitations of evaluation and  management by telemedicine and the availability of in person appointments. The patient expressed understanding and agreed to proceed.    History of Present Illness: Johnny Yang is a 41 y.o. who identifies as a male who was assigned male at birth, and is being seen today for Bilateral foot pain.  He is currently being followed by podiatry for metatarsalgia and capsulitis of both feet. Requesting pain medication today as weight bearing is causing him significant pain. It appears that podiatry had intentions on prescribing him meloxicam for this per the last office note however the medication unfortunately was not filled. Will fill today and have him follow up with podiatry as planned.    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 (Patient not taking: Reported on 08/18/2020), Disp: 8 g, Rfl: 0   benzonatate (TESSALON) 100 MG capsule, Take 1 capsule (100 mg total) by mouth 3 (three) times daily as needed., Disp: 30 capsule, Rfl: 0   cetirizine (ZYRTEC) 10 MG tablet, Take 10 mg by mouth daily., Disp: , Rfl:    ipratropium (ATROVENT) 0.03 %  nasal spray, Place 2 sprays into both nostrils every 12 (twelve) hours., Disp: 30 mL, Rfl: 12  Observations/Objective: Patient is well-developed, well-nourished in no acute distress.  Resting comfortably  at home.  Head is normocephalic, atraumatic.  No labored breathing.  Speech is clear and coherent with logical content.  Patient is alert and oriented at baseline.    Assessment and Plan: 1. Metatarsalgia of both feet - meloxicam (MOBIC) 15 MG tablet; Take 1 tablet (15 mg total) by mouth daily.  Dispense: 30 tablet; Refill: 0  Follow up with podiatry   Follow Up Instructions: I discussed the assessment and treatment plan with the patient. The patient was provided an opportunity to ask questions and all were answered. The patient agreed with the plan and demonstrated an understanding of the instructions.  A copy of instructions were sent to the patient via MyChart unless otherwise noted below.     The patient was advised to call back or seek an in-person evaluation if the symptoms worsen or if the condition fails to improve as anticipated.  Time:  I spent 14 minutes with the patient via telehealth technology discussing the above problems/concerns.    Claiborne Rigg, NP

## 2021-03-13 NOTE — Patient Instructions (Signed)
°  Johnny Yang, thank you for joining Claiborne Rigg, NP for today's virtual visit.  While this provider is not your primary care provider (PCP), if your PCP is located in our provider database this encounter information will be shared with them immediately following your visit.  Consent: (Patient) Johnny Yang provided verbal consent for this virtual visit at the beginning of the encounter.  Current Medications:  Current Outpatient Medications:    meloxicam (MOBIC) 15 MG tablet, Take 1 tablet (15 mg total) by mouth daily., Disp: 30 tablet, Rfl: 0   albuterol (VENTOLIN HFA) 108 (90 Base) MCG/ACT inhaler, Inhale 2 puffs into the lungs every 6 (six) hours as needed for wheezing or shortness of breath. (Patient not taking: Reported on 08/18/2020), Disp: 8 g, Rfl: 0   benzonatate (TESSALON) 100 MG capsule, Take 1 capsule (100 mg total) by mouth 3 (three) times daily as needed., Disp: 30 capsule, Rfl: 0   cetirizine (ZYRTEC) 10 MG tablet, Take 10 mg by mouth daily., Disp: , Rfl:    ipratropium (ATROVENT) 0.03 % nasal spray, Place 2 sprays into both nostrils every 12 (twelve) hours., Disp: 30 mL, Rfl: 12   Medications ordered in this encounter:  Meds ordered this encounter  Medications   meloxicam (MOBIC) 15 MG tablet    Sig: Take 1 tablet (15 mg total) by mouth daily.    Dispense:  30 tablet    Refill:  0    Order Specific Question:   Supervising Provider    Answer:   Hyacinth Meeker, BRIAN [[id number]]     *If you need refills on other medications prior to your next appointment, please contact your pharmacy*  Follow-Up: Call back or seek an in-person evaluation if the symptoms worsen or if the condition fails to improve as anticipated.     If you have been instructed to have an in-person evaluation today at a local Urgent Care facility, please use the link below. It will take you to a list of all of our available [place] [facility], including address, phone number and hours of  operation. Please do not delay care.  [facility]  If you or a family member do not have a primary care provider, use the link below to schedule a visit and establish care. When you choose a [place] primary care physician or advanced practice provider, you gain a long-term partner in health. Find a Primary Care Provider  Learn more about [facility]'s in-office and virtual care options:  - Get Care Now

## 2021-03-25 ENCOUNTER — Other Ambulatory Visit: Payer: Self-pay | Admitting: Podiatry

## 2021-03-25 ENCOUNTER — Encounter: Payer: Self-pay | Admitting: Podiatry

## 2021-03-25 MED ORDER — CELECOXIB 100 MG PO CAPS: 100.0000 mg | ORAL_CAPSULE | Freq: Two times a day (BID) | ORAL | 0 refills | Status: DC

## 2021-03-27 ENCOUNTER — Telehealth: Payer: 59 | Admitting: Emergency Medicine

## 2021-03-27 DIAGNOSIS — Z0289 Encounter for other administrative examinations: Secondary | ICD-10-CM

## 2021-03-27 DIAGNOSIS — M79671 Pain in right foot: Secondary | ICD-10-CM

## 2021-03-27 DIAGNOSIS — M79672 Pain in left foot: Secondary | ICD-10-CM

## 2021-03-27 NOTE — Progress Notes (Signed)
Virtual Visit Consent   Johnny Yang, you are scheduled for a virtual visit with a [facility] provider today.     Just as with appointments in the office, your consent must be obtained to participate.  Your consent will be active for this visit and any virtual visit you may have with one of our providers in the next 365 days.     If you have a MyChart account, a copy of this consent can be sent to you electronically.  All virtual visits are billed to your insurance company just like a traditional visit in the office.    As this is a virtual visit, video technology does not allow for your provider to perform a traditional examination.  This may limit your provider's ability to fully assess your condition.  If your provider identifies any concerns that need to be evaluated in person or the need to arrange testing (such as labs, EKG, etc.), we will make arrangements to do so.     Although advances in technology are sophisticated, we cannot ensure that it will always work on either your end or our end.  If the connection with a video visit is poor, the visit may have to be switched to a telephone visit.  With either a video or telephone visit, we are not always able to ensure that we have a secure connection.     I need to obtain your verbal consent now.   Are you willing to proceed with your visit today? Yes   Johnny Yang has provided verbal consent on 03/27/2021 for a virtual visit (video or telephone).   Johnny Yang, [place]   Date: 03/27/2021 8:04 PM   Virtual Visit via Video Note   I, Johnny Yang, connected with  Johnny Yang  ([id number], 1980-07-05) on 03/27/21 at  7:30 PM EST by a video-enabled telemedicine application and verified that I am speaking with the correct person using two identifiers.  Location: Patient: Virtual Visit Location Patient: Home Provider: Virtual Visit Location Provider: Home Office   I discussed the limitations of evaluation and  management by telemedicine and the availability of in person appointments. The patient expressed understanding and agreed to proceed.    History of Present Illness: Johnny Yang is a 41 y.o. who identifies as a male who was assigned male at birth, and is being seen today for work note.  Just picked up celebrex for foot pain that was prescribed by a foot and ankle specialist.  Would like work note for tonight because he works on his feet for 12-16 hours at a time.  Reports pain in bilateral feet for the past few years.  He describes pain as sore and achy.  Worse with working.  Had surgery in 2018.  Denies new injuries.  Reports some swelling in feet with working.  Denies fever, bruising, redness, numbness/ tingling.   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 (Patient not taking: Reported on 08/18/2020), Disp: 8 g, Rfl: 0   celecoxib (CELEBREX) 100 MG capsule, Take 1 capsule (100 mg total) by mouth 2 (two) times daily., Disp: 30 capsule, Rfl: 0   cetirizine (ZYRTEC) 10 MG tablet, Take 10 mg by mouth daily., Disp: , Rfl:    ipratropium (ATROVENT) 0.03 % nasal spray, Place 2 sprays into both nostrils every 12 (twelve) hours., Disp: 30  mL, Rfl: 12   meloxicam (MOBIC) 15 MG tablet, Take 1 tablet (15 mg total) by mouth daily., Disp: 30 tablet, Rfl: 0  Observations/Objective: Patient is well-developed, well-nourished in no acute distress.  Resting comfortably at home.  Head is normocephalic, atraumatic.  No labored breathing. Speaking in full sentences and tolerating own secretions Speech is clear and coherent with logical content.  Patient is alert and oriented at baseline.   Assessment and Plan: 1. Encounter to obtain excuse from work  2. Foot pain, bilateral  Work note given Continue conservative management of rest, ice, and elevation Continue with prescribed celebrex Follow up with specialist for further evaluation and management Present to ER if worsening or new symptoms (fever, chills, chest pain, abdominal pain, redness, swelling, bruising, numbness/ tingling, etc...)   Follow Up Instructions: I discussed the assessment and treatment plan with the patient. The patient was provided an opportunity to ask questions and all were answered. The patient agreed with the plan and demonstrated an understanding of the instructions.  A copy of instructions were sent to the patient via MyChart unless otherwise noted below.    The patient was advised to call back or seek an in-person evaluation if the symptoms worsen or if the condition fails to improve as anticipated.  Time:  I spent 5-10 minutes with the patient via telehealth technology discussing the above problems/concerns.    Johnny Harding, PA-C

## 2021-03-27 NOTE — Patient Instructions (Signed)
°  Johnny Yang, thank you for joining Lestine Box, PA-C for today's virtual visit.  While this provider is not your primary care provider (PCP), if your PCP is located in our provider database this encounter information will be shared with them immediately following your visit.  Consent: (Patient) Johnny Yang provided verbal consent for this virtual visit at the beginning of the encounter.  Current Medications:  Current Outpatient Medications:    albuterol (VENTOLIN HFA) 108 (90 Base) MCG/ACT inhaler, Inhale 2 puffs into the lungs every 6 (six) hours as needed for wheezing or shortness of breath. (Patient not taking: Reported on 08/18/2020), Disp: 8 g, Rfl: 0   celecoxib (CELEBREX) 100 MG capsule, Take 1 capsule (100 mg total) by mouth 2 (two) times daily., Disp: 30 capsule, Rfl: 0   cetirizine (ZYRTEC) 10 MG tablet, Take 10 mg by mouth daily., Disp: , Rfl:    ipratropium (ATROVENT) 0.03 % nasal spray, Place 2 sprays into both nostrils every 12 (twelve) hours., Disp: 30 mL, Rfl: 12   meloxicam (MOBIC) 15 MG tablet, Take 1 tablet (15 mg total) by mouth daily., Disp: 30 tablet, Rfl: 0   Medications ordered in this encounter:  No orders of the defined types were placed in this encounter.    *If you need refills on other medications prior to your next appointment, please contact your pharmacy*  Follow-Up: Call back or seek an in-person evaluation if the symptoms worsen or if the condition fails to improve as anticipated.  Other Instructions Work note given Continue conservative management of rest, ice, and elevation Continue with prescribed celebrex Follow up with specialist for further evaluation and management Present to ER if worsening or new symptoms (fever, chills, chest pain, abdominal pain, redness, swelling, bruising, numbness/ tingling, etc...)    If you have been instructed to have an in-person evaluation today at a local Urgent Care facility, please use the link  below. It will take you to a list of all of our available [place] [facility], including address, phone number and hours of operation. Please do not delay care.  [facility]  If you or a family member do not have a primary care provider, use the link below to schedule a visit and establish care. When you choose a [facility] primary care physician or advanced practice provider, you gain a long-term partner in health. Find a Primary Care Provider  Learn more about [facility]'s in-office and virtual care options: [facility] Now

## 2021-04-05 ENCOUNTER — Other Ambulatory Visit: Payer: Self-pay

## 2021-04-05 ENCOUNTER — Ambulatory Visit: Payer: 59

## 2021-04-05 DIAGNOSIS — M778 Other enthesopathies, not elsewhere classified: Secondary | ICD-10-CM

## 2021-04-05 DIAGNOSIS — M205X9 Other deformities of toe(s) (acquired), unspecified foot: Secondary | ICD-10-CM

## 2021-04-05 DIAGNOSIS — M7742 Metatarsalgia, left foot: Secondary | ICD-10-CM

## 2021-04-05 DIAGNOSIS — M7741 Metatarsalgia, right foot: Secondary | ICD-10-CM

## 2021-04-05 NOTE — Progress Notes (Signed)
SITUATION: Reason for Visit: Fitting and Delivery of Custom Fabricated Foot Orthoses Patient Report: Patient reports comfort and is satisfied with device.  OBJECTIVE DATA: Patient History / Diagnosis:     ICD-10-CM   1. Capsulitis of foot, left  M77.8     2. Capsulitis of foot, right  M77.8     3. Metatarsalgia of both feet  M77.41    M77.42     4. Hallux limitus, unspecified laterality  M20.5X9       Provided Device:  Custom Functional Foot Orthotics     [facility]: [phone]  GOAL OF ORTHOSIS - Improve gait - Decrease energy expenditure - Improve Balance - Provide Triplanar stability of foot complex - Facilitate motion  ACTIONS PERFORMED Patient was fit with foot orthotics trimmed to shoe last. Patient tolerated fittign procedure.   Patient was provided with verbal and written instruction and demonstration regarding donning, doffing, wear, care, proper fit, function, purpose, cleaning, and use of the orthosis and in all related precautions and risks and benefits regarding the orthosis.  Patient was also provided with verbal instruction regarding how to report any failures or malfunctions of the orthosis and necessary follow up care. Patient was also instructed to contact our office regarding any change in status that may affect the function of the orthosis.  Patient demonstrated independence with proper donning, doffing, and fit and verbalized understanding of all instructions.  PLAN: Patient is to follow up in one week or as necessary (PRN). All questions were answered and concerns addressed. Plan of care was discussed with and agreed upon by the patient.

## 2021-04-20 ENCOUNTER — Telehealth: Payer: 59 | Admitting: Nurse Practitioner

## 2021-04-20 DIAGNOSIS — R051 Acute cough: Secondary | ICD-10-CM

## 2021-04-20 MED ORDER — BENZONATATE 100 MG PO CAPS: 100.0000 mg | ORAL_CAPSULE | Freq: Three times a day (TID) | ORAL | 0 refills | Status: DC | PRN

## 2021-04-20 NOTE — Progress Notes (Signed)
We are sorry that you are not feeling well.  Here is how we plan to help!  I strongly suggest that you do covid test. If comes back positive yu will need to do a video visit for treatment.  Based on your presentation I believe you most likely have A cough due to a virus.  This is called viral bronchitis and is best treated by rest, plenty of fluids and control of the cough.  You may use Ibuprofen or Tylenol as directed to help your symptoms.     In addition you may use A prescription cough medication called Tessalon Perles 100mg . You may take 1-2 capsules every 8 hours as needed for your cough.   From your responses in the eVisit questionnaire you describe inflammation in the upper respiratory tract which is causing a significant cough.  This is commonly called Bronchitis and has four common causes:   Allergies Viral Infections Acid Reflux Bacterial Infection Allergies, viruses and acid reflux are treated by controlling symptoms or eliminating the cause. An example might be a cough caused by taking certain blood pressure medications. You stop the cough by changing the medication. Another example might be a cough caused by acid reflux. Controlling the reflux helps control the cough.  USE OF BRONCHODILATOR ("RESCUE") INHALERS: There is a risk from using your bronchodilator too frequently.  The risk is that over-reliance on a medication which only relaxes the muscles surrounding the breathing tubes can reduce the effectiveness of medications prescribed to reduce swelling and congestion of the tubes themselves.  Although you feel brief relief from the bronchodilator inhaler, your asthma may actually be worsening with the tubes becoming more swollen and filled with mucus.  This can delay other crucial treatments, such as oral steroid medications. If you need to use a bronchodilator inhaler daily, several times per day, you should discuss this with your provider.  There are probably better treatments that  could be used to keep your asthma under control.     HOME CARE Only take medications as instructed by your medical team. Complete the entire course of an antibiotic. Drink plenty of fluids and get plenty of rest. Avoid close contacts especially the very young and the elderly Cover your mouth if you cough or cough into your sleeve. Always remember to wash your hands A steam or ultrasonic humidifier can help congestion.   GET HELP RIGHT AWAY IF: You develop worsening fever. You become short of breath You cough up blood. Your symptoms persist after you have completed your treatment plan MAKE SURE YOU  Understand these instructions. Will watch your condition. Will get help right away if you are not doing well or get worse.    Thank you for choosing an e-visit.  Your e-visit answers were reviewed by a board certified advanced clinical practitioner to complete your personal care plan. Depending upon the condition, your plan could have included both over the counter or prescription medications.  Please review your pharmacy choice. Make sure the pharmacy is open so you can pick up prescription now. If there is a problem, you may contact your provider through [employer] and have the prescription routed to another pharmacy.  Your safety is important to [place]. If you have drug allergies check your prescription carefully.   For the next 24 hours you can use MyChart to ask questions about today's visit, request a non-urgent call back, or ask for a work or school excuse. You will get an email in the next two  days asking about your experience. I hope that your e-visit has been valuable and will speed your recovery.  5-10 minutes spent reviewing and documenting in chart.

## 2021-04-22 NOTE — Telephone Encounter (Signed)
Marylene Land, did you get this paperwork?

## 2021-04-23 DIAGNOSIS — M79676 Pain in unspecified toe(s): Secondary | ICD-10-CM

## 2021-05-03 ENCOUNTER — Ambulatory Visit: Payer: 59 | Admitting: Podiatry

## 2021-05-03 ENCOUNTER — Ambulatory Visit: Payer: 59

## 2021-05-03 ENCOUNTER — Other Ambulatory Visit: Payer: Self-pay

## 2021-05-03 ENCOUNTER — Encounter: Payer: Self-pay | Admitting: Podiatry

## 2021-05-03 DIAGNOSIS — M778 Other enthesopathies, not elsewhere classified: Secondary | ICD-10-CM | POA: Diagnosis not present

## 2021-05-03 DIAGNOSIS — M7742 Metatarsalgia, left foot: Secondary | ICD-10-CM | POA: Diagnosis not present

## 2021-05-03 DIAGNOSIS — M205X9 Other deformities of toe(s) (acquired), unspecified foot: Secondary | ICD-10-CM

## 2021-05-03 DIAGNOSIS — M722 Plantar fascial fibromatosis: Secondary | ICD-10-CM | POA: Diagnosis not present

## 2021-05-03 DIAGNOSIS — M7741 Metatarsalgia, right foot: Secondary | ICD-10-CM | POA: Diagnosis not present

## 2021-05-03 MED ORDER — METHYLPREDNISOLONE 4 MG PO TBPK: ORAL_TABLET | ORAL | 0 refills | Status: DC

## 2021-05-03 NOTE — Progress Notes (Signed)
SITUATION ?Reason for Consult: Follow-up with bilateral foot orthotics ?Patient / Caregiver Report: Patient reports needing more arch support and pain in his hallux after work ? ?OBJECTIVE DATA ?History / Diagnosis:  ?  ICD-10-CM   ?1. Capsulitis of foot, left  M77.8   ?  ?2. Capsulitis of foot, right  M77.8   ?  ?3. Metatarsalgia of both feet  M77.41   ? M77.42   ?  ?4. Hallux limitus, unspecified laterality  M20.5X9   ?  ? ? ?Change in Pathology: None ? ?ACTIONS PERFORMED ?Patient's equipment was checked for structural stability and fit. Tested with felt scaphoid and reverse mortons soft pads. Patient reported improvement. Will send devices for modification. Device(s) intact and fit is excellent. All questions answered and concerns addressed. ? ?PLAN ?Patient to return when devices ready. Plan of care discussed with and agreed upon by patient / caregiver. ? ?

## 2021-05-03 NOTE — Progress Notes (Signed)
Subjective: ?41 year old male presents the office today for follow evaluation of bilateral foot pain.  He does get still gets aching sensations to both of his big toe joints but is also scraping the arch of his foot into his heel.  He states that he uses feet elevated at nighttime which does help.  He states he has constant pain but the level of the discomfort varies.  The more is on his feet and hurts worse when he is on his feet a lot at work.  Orthotics are helping some.  He also gets pain in the morning when he first gets up.  No recent injury or any other changes. ? ?Objective: ?AAO x3, NAD ?DP/PT pulses palpable bilaterally, CRT less than 3 seconds ?Decreased range of motion noted to the first MPJs bilaterally.  Minimal edema of the first MPJ on the right side worse than left.  He does get discomfort of the medial band of plantar fascia along the insertion of the calcaneus as well as the arch of the foot.  There is no pain with lateral compression of calcaneus.  No edema.  Negative Tinel sign.  Decreased medial arch upon weightbearing.  No pain with calf compression, swelling, warmth, erythema ? ?Assessment: ?Plantar fasciitis, capsulitis with hallux limitus ? ?Plan: ?-All treatment options discussed with the patient including all alternatives, risks, complications.  ?-Prescribed Medrol Dosepak.  Hold off on any other anti-inflammatories while on this. ?-Discussed stretching, icing daily. ?-Discussed she modifications and also follow-up with our orthotist Arlys Brayan for further modifications of his orthotics. ?-Dispensed night splint given the morning discomfort. ?-Patient encouraged to call the office with any questions, concerns, change in symptoms.  ? ?Johnny Yang DPM ? ? ?

## 2021-05-03 NOTE — Patient Instructions (Addendum)
Do not take the mobic (meloxicam) while on the steroid.   For instructions on how to put on your Night Splint, please visit [email or web address]  Plantar Fasciitis (Heel Spur Syndrome) with Rehab The plantar fascia is a fibrous, ligament-like, soft-tissue structure that spans the bottom of the foot. Plantar fasciitis is a condition that causes pain in the foot due to inflammation of the tissue. SYMPTOMS  Pain and tenderness on the underneath side of the foot. Pain that worsens with standing or walking. CAUSES  Plantar fasciitis is caused by irritation and injury to the plantar fascia on the underneath side of the foot. Common mechanisms of injury include: Direct trauma to bottom of the foot. Damage to a small nerve that runs under the foot where the main fascia attaches to the heel bone. Stress placed on the plantar fascia due to bone spurs. RISK INCREASES WITH:  Activities that place stress on the plantar fascia (running, jumping, pivoting, or cutting). Poor strength and flexibility. Improperly fitted shoes. Tight calf muscles. Flat feet. Failure to warm-up properly before activity. Obesity. PREVENTION Warm up and stretch properly before activity. Allow for adequate recovery between workouts. Maintain physical fitness: Strength, flexibility, and endurance. Cardiovascular fitness. Maintain a health body weight. Avoid stress on the plantar fascia. Wear properly fitted shoes, including arch supports for individuals who have flat feet.  PROGNOSIS  If treated properly, then the symptoms of plantar fasciitis usually resolve without surgery. However, occasionally surgery is necessary.  RELATED COMPLICATIONS  Recurrent symptoms that may result in a chronic condition. Problems of the lower back that are caused by compensating for the injury, such as limping. Pain or weakness of the foot during push-off following surgery. Chronic inflammation, scarring, and partial or complete  fascia tear, occurring more often from repeated injections.  TREATMENT  Treatment initially involves the use of ice and medication to help reduce pain and inflammation. The use of strengthening and stretching exercises may help reduce pain with activity, especially stretches of the Achilles tendon. These exercises may be performed at home or with a therapist. Your caregiver may recommend that you use heel cups of arch supports to help reduce stress on the plantar fascia. Occasionally, corticosteroid injections are given to reduce inflammation. If symptoms persist for greater than 6 months despite non-surgical (conservative), then surgery may be recommended.   MEDICATION  If pain medication is necessary, then nonsteroidal anti-inflammatory medications, such as aspirin and ibuprofen, or other minor pain relievers, such as acetaminophen, are often recommended. Do not take pain medication within 7 days before surgery. Prescription pain relievers may be given if deemed necessary by your caregiver. Use only as directed and only as much as you need. Corticosteroid injections may be given by your caregiver. These injections should be reserved for the most serious cases, because they may only be given a certain number of times.  HEAT AND COLD Cold treatment (icing) relieves pain and reduces inflammation. Cold treatment should be applied for 10 to 15 minutes every 2 to 3 hours for inflammation and pain and immediately after any activity that aggravates your symptoms. Use ice packs or massage the area with a piece of ice (ice massage). Heat treatment may be used prior to performing the stretching and strengthening activities prescribed by your caregiver, physical therapist, or athletic trainer. Use a heat pack or soak the injury in warm water.  SEEK IMMEDIATE MEDICAL CARE IF: Treatment seems to offer no benefit, or the condition worsens. Any medications produce adverse side effects.  EXERCISES-  RANGE OF  MOTION (ROM) AND STRETCHING EXERCISES - Plantar Fasciitis (Heel Spur Syndrome) These exercises may help you when beginning to rehabilitate your injury. Your symptoms may resolve with or without further involvement from your physician, physical therapist or athletic trainer. While completing these exercises, remember:  Restoring tissue flexibility helps normal motion to return to the joints. This allows healthier, less painful movement and activity. An effective stretch should be held for at least 30 seconds. A stretch should never be painful. You should only feel a gentle lengthening or release in the stretched tissue.  RANGE OF MOTION - Toe Extension, Flexion Sit with your right / left leg crossed over your opposite knee. Grasp your toes and gently pull them back toward the top of your foot. You should feel a stretch on the bottom of your toes and/or foot. Hold this stretch for 10 seconds. Now, gently pull your toes toward the bottom of your foot. You should feel a stretch on the top of your toes and or foot. Hold this stretch for 10 seconds. Repeat  times. Complete this stretch 3 times per day.   RANGE OF MOTION - Ankle Dorsiflexion, Active Assisted Remove shoes and sit on a chair that is preferably not on a carpeted surface. Place right / left foot under knee. Extend your opposite leg for support. Keeping your heel down, slide your right / left foot back toward the chair until you feel a stretch at your ankle or calf. If you do not feel a stretch, slide your bottom forward to the edge of the chair, while still keeping your heel down. Hold this stretch for 10 seconds. Repeat 3 times. Complete this stretch 2 times per day.   STRETCH  Gastroc, Standing Place hands on wall. Extend right / left leg, keeping the front knee somewhat bent. Slightly point your toes inward on your back foot. Keeping your right / left heel on the floor and your knee straight, shift your weight toward the wall, not  allowing your back to arch. You should feel a gentle stretch in the right / left calf. Hold this position for 10 seconds. Repeat 3 times. Complete this stretch 2 times per day.  STRETCH  Soleus, Standing Place hands on wall. Extend right / left leg, keeping the other knee somewhat bent. Slightly point your toes inward on your back foot. Keep your right / left heel on the floor, bend your back knee, and slightly shift your weight over the back leg so that you feel a gentle stretch deep in your back calf. Hold this position for 10 seconds. Repeat 3 times. Complete this stretch 2 times per day.  STRETCH  Gastrocsoleus, Standing  Note: This exercise can place a lot of stress on your foot and ankle. Please complete this exercise only if specifically instructed by your caregiver.  Place the ball of your right / left foot on a step, keeping your other foot firmly on the same step. Hold on to the wall or a rail for balance. Slowly lift your other foot, allowing your body weight to press your heel down over the edge of the step. You should feel a stretch in your right / left calf. Hold this position for 10 seconds. Repeat this exercise with a slight bend in your right / left knee. Repeat 3 times. Complete this stretch 2 times per day.   STRENGTHENING EXERCISES - Plantar Fasciitis (Heel Spur Syndrome)  These exercises may help you when beginning to rehabilitate your injury.  They may resolve your symptoms with or without further involvement from your physician, physical therapist or athletic trainer. While completing these exercises, remember:  Muscles can gain both the endurance and the strength needed for everyday activities through controlled exercises. Complete these exercises as instructed by your physician, physical therapist or athletic trainer. Progress the resistance and repetitions only as guided.  STRENGTH - Towel Curls Sit in a chair positioned on a non-carpeted surface. Place your foot  on a towel, keeping your heel on the floor. Pull the towel toward your heel by only curling your toes. Keep your heel on the floor. Repeat 3 times. Complete this exercise 2 times per day.  STRENGTH - Ankle Inversion Secure one end of a rubber exercise band/tubing to a fixed object (table, pole). Loop the other end around your foot just before your toes. Place your fists between your knees. This will focus your strengthening at your ankle. Slowly, pull your big toe up and in, making sure the band/tubing is positioned to resist the entire motion. Hold this position for 10 seconds. Have your muscles resist the band/tubing as it slowly pulls your foot back to the starting position. Repeat 3 times. Complete this exercises 2 times per day.  Document Released: 02/14/2005 Document Revised: 05/09/2011 Document Reviewed: 05/29/2008 [facility] Patient Information 2014 [place], [place].

## 2021-05-04 ENCOUNTER — Telehealth: Payer: Self-pay

## 2021-05-04 NOTE — Telephone Encounter (Signed)
Foot Orthotics sent to [facility] for adjustment ?

## 2021-05-18 ENCOUNTER — Telehealth: Payer: Self-pay | Admitting: Podiatry

## 2021-05-18 NOTE — Telephone Encounter (Signed)
lvm for patient to call back and schedule an appointment for orthotic pick up ?

## 2021-06-14 ENCOUNTER — Ambulatory Visit: Payer: 59

## 2021-06-14 ENCOUNTER — Encounter: Payer: Self-pay | Admitting: Podiatry

## 2021-06-14 ENCOUNTER — Ambulatory Visit: Payer: 59 | Admitting: Podiatry

## 2021-06-14 DIAGNOSIS — M205X9 Other deformities of toe(s) (acquired), unspecified foot: Secondary | ICD-10-CM | POA: Diagnosis not present

## 2021-06-14 DIAGNOSIS — M7741 Metatarsalgia, right foot: Secondary | ICD-10-CM

## 2021-06-14 DIAGNOSIS — M722 Plantar fascial fibromatosis: Secondary | ICD-10-CM

## 2021-06-14 DIAGNOSIS — G629 Polyneuropathy, unspecified: Secondary | ICD-10-CM | POA: Diagnosis not present

## 2021-06-14 DIAGNOSIS — M7742 Metatarsalgia, left foot: Secondary | ICD-10-CM | POA: Diagnosis not present

## 2021-06-14 DIAGNOSIS — M778 Other enthesopathies, not elsewhere classified: Secondary | ICD-10-CM

## 2021-06-14 NOTE — Progress Notes (Signed)
SITUATION: ?Reason for Visit: Fitting and Delivery of Custom Fabricated Foot Orthoses ?Patient Report: Patient reports comfort and is satisfied with device. ? ?OBJECTIVE DATA: ?Patient History / Diagnosis:   ?  ICD-10-CM   ?1. Capsulitis of foot, left  M77.8   ?  ?2. Capsulitis of foot, right  M77.8   ?  ?3. Metatarsalgia of both feet  M77.41   ? M77.42   ?  ?4. Hallux limitus, unspecified laterality  M20.5X9   ?  ?5. Plantar fasciitis  M72.2   ?  ? ? ?Provided Device:  Custom Functional Foot Orthotics ?    [facility]: [id number] ? ?GOAL OF ORTHOSIS ?- Improve gait ?- Decrease energy expenditure ?- Improve Balance ?- Provide Triplanar stability of foot complex ?- Facilitate motion ? ?ACTIONS PERFORMED ?Patient was fit with foot orthotics trimmed to shoe last. Patient tolerated fittign procedure.  ? ?Patient was provided with verbal and written instruction and demonstration regarding donning, doffing, wear, care, proper fit, function, purpose, cleaning, and use of the orthosis and in all related precautions and risks and benefits regarding the orthosis. ? ?Patient was also provided with verbal instruction regarding how to report any failures or malfunctions of the orthosis and necessary follow up care. Patient was also instructed to contact our office regarding any change in status that may affect the function of the orthosis. ? ?Patient demonstrated independence with proper donning, doffing, and fit and verbalized understanding of all instructions. ? ?PLAN: ?Patient is to follow up in one week or as necessary (PRN). All questions were answered and concerns addressed. Plan of care was discussed with and agreed upon by the patient. ? ?

## 2021-06-15 NOTE — Progress Notes (Signed)
Subjective: ?41 year old male presents the office today for follow evaluation of bilateral foot pain.  Today he states he has been getting discomfort, throbbing sensation of his legs also being tingling up to the knees and both feet.  No recent injury or falls otherwise since I last saw him. ? ?Objective: ?AAO x3, NAD ?DP/PT pulses palpable bilaterally, CRT less than 3 seconds ?Sensation is mildly decreased today with Semmes Weinstein monofilament to the forefoot more in the left than the right ?Decreased range of motion noted to the first MPJs bilaterally.  Minimal edema of the first MPJ on the right side worse than left.  Surgically he does get discomfort on the medial band plantar fascia left, insertion of calcaneus.  There is no pain Achilles tendon. ?Negative Tinel sign bilaterally. ?Decreased medial arch upon weightbearing.  No pain with calf compression, swelling, warmth, erythema ? ?Assessment: ?Plantar fasciitis, capsulitis with hallux limitus; concern for possible nerve issues ? ?Plan: ?-All treatment options discussed with the patient including all alternatives, risks, complications.  ?-ABI was done in the office today to ensure adequate circulation.  This was normal.  Right was 1.2 open left was 1.13. ?-Given the tingling up to the knees on both feet and today he does have mild decreased sensation with Phoebe Perch monofilament we will refer him to neurology.  ?-I do still think he is having plantar fasciitis and capsulitis of his foot but that should not be causing the tingling in the legs.  Would continue with stretching I have referred him to physical therapy as well.  Continue with shoes and good arch support we discussed shoe modifications. ?-Discussed smoking cessation today. ? ?40 minutes were spent with the patient today discussing treatment, doing a ABI and the plan. Greater than 50% of this time was in face to face contact.  ? ? ?Vivi Barrack DPM ? ? ?

## 2021-11-08 ENCOUNTER — Encounter: Payer: Self-pay | Admitting: Family Medicine

## 2021-11-08 ENCOUNTER — Ambulatory Visit (HOSPITAL_BASED_OUTPATIENT_CLINIC_OR_DEPARTMENT_OTHER)
Admission: RE | Admit: 2021-11-08 | Discharge: 2021-11-08 | Disposition: A | Discharge status: Home / Self Care | Payer: BC Managed Care – PPO | Source: Ambulatory Visit | Attending: Family Medicine | Admitting: Family Medicine

## 2021-11-08 ENCOUNTER — Ambulatory Visit: Payer: BC Managed Care – PPO | Admitting: Family Medicine

## 2021-11-08 VITALS — BP 145/89 | HR 91 | Temp 97.7°F | Resp 18 | Ht 74.0 in | Wt 241.0 lb

## 2021-11-08 DIAGNOSIS — R079 Chest pain, unspecified: Secondary | ICD-10-CM | POA: Diagnosis not present

## 2021-11-08 DIAGNOSIS — R142 Eructation: Secondary | ICD-10-CM | POA: Diagnosis not present

## 2021-11-08 DIAGNOSIS — Z131 Encounter for screening for diabetes mellitus: Secondary | ICD-10-CM | POA: Diagnosis not present

## 2021-11-08 DIAGNOSIS — Z13220 Encounter for screening for lipoid disorders: Secondary | ICD-10-CM

## 2021-11-08 DIAGNOSIS — R7303 Prediabetes: Secondary | ICD-10-CM

## 2021-11-08 LAB — LIPID PANEL
Cholesterol: 173 mg/dL (ref 0–200)
HDL: 32.6 mg/dL — ABNORMAL LOW (ref >39.00)
LDL Cholesterol: 103 mg/dL — ABNORMAL HIGH (ref 0–99)
NonHDL: 140.56
Total CHOL/HDL Ratio: 5
Triglycerides: 189 mg/dL — ABNORMAL HIGH (ref 0.0–149.0)
VLDL: 37.8 mg/dL (ref 0.0–40.0)

## 2021-11-08 LAB — COMPREHENSIVE METABOLIC PANEL
ALT: 30 U/L (ref 0–53)
AST: 24 U/L (ref 0–37)
Albumin: 4.4 g/dL (ref 3.5–5.2)
Alkaline Phosphatase: 64 U/L (ref 39–117)
BUN: 18 mg/dL (ref 6–23)
CO2: 27 mEq/L (ref 19–32)
Calcium: 9.9 mg/dL (ref 8.4–10.5)
Chloride: 104 mEq/L (ref 96–112)
Creatinine, Ser: 1.25 mg/dL (ref 0.40–1.50)
GFR: 72.8 mL/min (ref >60.00)
Glucose, Bld: 95 mg/dL (ref 70–99)
Potassium: 3.8 mEq/L (ref 3.5–5.1)
Sodium: 139 mEq/L (ref 135–145)
Total Bilirubin: 0.4 mg/dL (ref 0.2–1.2)
Total Protein: 7.8 g/dL (ref 6.0–8.3)

## 2021-11-08 LAB — CBC
HCT: 44.3 % (ref 39.0–52.0)
Hemoglobin: 15.1 g/dL (ref 13.0–17.0)
MCHC: 34 g/dL (ref 30.0–36.0)
MCV: 91.9 fl (ref 78.0–100.0)
Platelets: 285 10*3/uL (ref 150.0–400.0)
RBC: 4.82 Mil/uL (ref 4.22–5.81)
RDW: 12.9 % (ref 11.5–15.5)
WBC: 9.1 10*3/uL (ref 4.0–10.5)

## 2021-11-08 LAB — D-DIMER, QUANTITATIVE: D-Dimer, Quant: <0.19 mcg/mL FEU (ref <0.50)

## 2021-11-08 LAB — TROPONIN I (HIGH SENSITIVITY): High Sens Troponin I: 6 ng/L (ref 2–17)

## 2021-11-08 LAB — HEMOGLOBIN A1C: Hgb A1c MFr Bld: 5.9 % (ref 4.6–6.5)

## 2021-11-08 MED ORDER — SUCRALFATE 1 G PO TABS: 1.0000 g | ORAL_TABLET | Freq: Three times a day (TID) | ORAL | 0 refills | Status: DC

## 2021-11-08 MED ORDER — ALUM & MAG HYDROXIDE-SIMETH 200-200-20 MG/5ML PO SUSP: 30.0000 mL | Freq: Once | ORAL | Status: AC

## 2021-11-08 MED ORDER — PANTOPRAZOLE SODIUM 40 MG PO TBEC: 40.0000 mg | DELAYED_RELEASE_TABLET | Freq: Every day | ORAL | 3 refills | Status: DC

## 2021-11-08 MED ORDER — ALBUTEROL SULFATE HFA 108 (90 BASE) MCG/ACT IN AERS: 2.0000 | INHALATION_SPRAY | Freq: Four times a day (QID) | RESPIRATORY_TRACT | 0 refills | Status: AC | PRN

## 2021-11-08 NOTE — Patient Instructions (Addendum)
It was good to see you today- please let us know if you are getting worse or not feeling better in the next couple of days!   If any significant worsening go to the ER!  We will get labs and a chest x-ray today I think stomach acid/ gastritis is the most likely culprit here  We will use a proton pump inhibitor/ protonix- if this is not covered get some prilosec OTC! Take for about one month Also carafate with meals and bedtime for 10 days  You can also try the albuterol inhaler to see if this may be helpful for you

## 2021-11-08 NOTE — Progress Notes (Addendum)
[facility] [address] [place], [place] [address] [phone]  Date:  11/08/2021   Name:  Johnny Yang   DOB:  1980-09-19   MRN:  [id number]  PCP:  Olive Bass, FNP    Chief Complaint: Chest Pain (Suspected reflux. /)   History of Present Illness:  Johnny Yang is a 41 y.o. very pleasant male patient who presents with the following:  Patient of my partner Ria Clock, nurse practitioner.  Here today with concern of chest pain which he has noted daily for about 3 weeks History of allergies, migraine headache, anxiety/PTSD I have not seen him myself previously He did have an EKG in 2020, no other cardiac evaluation on chart  He notes the CP in the right chest It can be associated with a HA sometimes Moving his trunk or taking a deep breath can increase the pain A cold drink may help He tried tums but not that helpful He also tried OTC pain meds -Aleve, Tylenol, BC powders -but did not help  No unusual SOB, no cough or wheezing He also admits to history of asthma- not taking any med for this at this time He works in landscaping-he has not noted any definite change in his symptoms with exertion  Occasional cigars- no other smoking, vaping, etc No family history of CAD or hypertension as far as he knows  He did have a similar issue about 3 years ago, was seen in the ER and evaluated  He has noted increased burping recently  Patient Active Problem List   Diagnosis Date Noted   Gasping for breath 01/31/2019   PTSD (post-traumatic stress disorder) 01/31/2019   Snoring 01/31/2019   Circadian rhythm sleep disorder, 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   Excessive daytime sleepiness 01/31/2019   Sore throat 02/08/2018   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   Allergic conjunctivitis 08/18/2017   Hallux limitus of right foot 01/19/2016   Testicular pain 05/28/2015   ANXIETY STATE, UNSPECIFIED 10/31/2008    Seasonal and perennial allergic rhinitis 10/31/2008   Migraine equivalent 10/31/2008    Past Medical History:  Diagnosis Date   Allergy    seasonal   Urticaria     Past Surgical History:  Procedure Laterality Date   FOOT SURGERY Left    TONSILLECTOMY AND ADENOIDECTOMY      Social History   Tobacco Use   Smoking status: Light Smoker    Types: Cigars   Smokeless tobacco: Never   Tobacco comments:    2x per month- cigar  Vaping Use   Vaping Use: Never used  Substance Use Topics   Alcohol use: Yes    Comment:  occasionally as 2-3 beverages 2-3 X/ week   Drug use: No    Family History  Problem Relation Age of Onset   Cancer Paternal Aunt        ovarian   Allergic rhinitis Mother    Allergic rhinitis Father    Asthma Neg Hx    Angioedema Neg Hx    Eczema Neg Hx    Immunodeficiency Neg Hx    Urticaria Neg Hx     No Known Allergies  Medication list has been reviewed and updated.  Current Outpatient Medications on File Prior to Visit  Medication Sig Dispense Refill   cetirizine (ZYRTEC) 10 MG tablet Take 10 mg by mouth daily.     No current facility-administered medications on file prior to visit.  Review of Systems:  As per HPI- otherwise negative.  BP Readings from Last 3 Encounters:  11/08/21 (!) 145/89  08/18/20 (!) 128/92  02/05/20 130/82     Physical Examination: Vitals:   11/08/21 1405 11/08/21 1438  BP: (!) 154/94 (!) 145/89  Pulse: 91   Resp: 18   Temp: 97.7 F (36.5 C)   SpO2: 98%    Vitals:   11/08/21 1405  Weight: 241 lb (109.3 kg)  Height: 6\' 2"  (1.88 m)   Body mass index is 30.94 kg/m. Ideal Body Weight: Weight in (lb) to have BMI = 25: 194.3  GEN: no acute distress. Well appearing young man  HEENT: Atraumatic, Normocephalic.  Bilateral TM wnl, oropharynx normal.  PEERL,EOMI.   Ears and Nose: No external deformity. CV: RRR, No M/G/R. No JVD. No thrill. No extra heart sounds. PULM: CTA B, no wheezes, crackles,  rhonchi. No retractions. No resp. distress. No accessory muscle use. ABD: S, NT, ND, +BS. No rebound. No HSM. EXTR: No c/c/e PSYCH: Normally interactive. Conversant.  Burped several times during visit today I am unable to reproduce pain by pressing on the chest wall  EKG: SR, normal Compared with tracing from 2020 no significant change is noted   Patient given GI cocktail-unfortunately the lidocaine component is on backorder so were not able to use.  He noted some improvement of his symptoms with partial GI cocktail Assessment and Plan: Chest pain, unspecified type - Plan: EKG 12-Lead, DG Chest 2 View, Troponin I (High Sensitivity), D-Dimer, Quantitative, albuterol (VENTOLIN HFA) 108 (90 Base) MCG/ACT inhaler, alum & mag hydroxide-simeth (MAALOX/MYLANTA) 200-200-20 MG/5ML suspension 30 mL  Burping - Plan: pantoprazole (PROTONIX) 40 MG tablet, sucralfate (CARAFATE) 1 g tablet, CBC, Comprehensive metabolic panel, H. pylori breath test, alum & mag hydroxide-simeth (MAALOX/MYLANTA) 200-200-20 MG/5ML suspension 30 mL  Screening for hyperlipidemia - Plan: Lipid panel  Screening for diabetes mellitus - Plan: Hemoglobin A1c  Patient seen today with chest pain for about 3 weeks.  Pain is more on the right side, it is not exertional but seems to come and go all day long.  I offered to have him seen in the ER now as his most conservative option, advised they can do further evaluation such as cardiac enzymes immediately.  However, patient declines at this time-he prefers to pursue further outpatient work-up right now. We will obtain chest x-ray, D-dimer and troponin as well as more routine labs listed above Strongest suspicion based on his symptoms and risk factors is GERD; unfortunately we cannot do an H pylori breath test just now as he took the GI cocktail We will start on a PPI, Carafate Advised him to use Tylenol as needed for pain, stop NSAIDs and especially BC powders as these may irritate his  stomach  Johnny Yang is advised to please seek care at the ER should he be getting worse or if symptoms are changing Otherwise I will be in touch with his labs ASAP, provided albuterol inhaler in case this may be helpful  Signed Jonny Ruiz, MD  Received chest x-ray and troponin-message to patient Results for orders placed or performed in visit on 11/08/21  Troponin I (High Sensitivity)  Result Value Ref Range   High Sens Troponin I 6 2 - 17 ng/L   DG Chest 2 View  Result Date: 11/08/2021 CLINICAL DATA:  Right-sided chest pain EXAM: CHEST - 2 VIEW COMPARISON:  10/31/2018 FINDINGS: The heart size and mediastinal contours are within normal limits. Both lungs are clear. The  visualized skeletal structures are unremarkable. IMPRESSION: No acute abnormality of the lungs. Electronically Signed   By: Jearld Lesch M.D.   On: 11/08/2021 15:21    Received additional blood work as below, called patient D-dimer, troponin both negative Went over his other labs as below.  Mild prediabetes, would like to see HDL a bit higher.  Recommend working on exercise, perhaps add an omega-3 supplement. Otherwise, we hope that treating him for GERD will resolve his symptoms.  I have asked him to please let me know how he does over the next couple of days.  However, if getting worse please seek care at the emergency room.  He states understanding and agreement Results for orders placed or performed in visit on 11/08/21  D-Dimer, Quantitative  Result Value Ref Range   D-Dimer, Quant <0.19 <0.50 mcg/mL FEU  CBC  Result Value Ref Range   WBC 9.1 4.0 - 10.5 K/uL   RBC 4.82 4.22 - 5.81 Mil/uL   Platelets 285.0 150.0 - 400.0 K/uL   Hemoglobin 15.1 13.0 - 17.0 g/dL   HCT 47.6 54.6 - 50.3 %   MCV 91.9 78.0 - 100.0 fl   MCHC 34.0 30.0 - 36.0 g/dL   RDW 54.6 56.8 - 12.7 %  Comprehensive metabolic panel  Result Value Ref Range   Sodium 139 135 - 145 mEq/L   Potassium 3.8 3.5 - 5.1 mEq/L   Chloride 104 96 - 112 mEq/L    CO2 27 19 - 32 mEq/L   Glucose, Bld 95 70 - 99 mg/dL   BUN 18 6 - 23 mg/dL   Creatinine, Ser 5.17 0.40 - 1.50 mg/dL   Total Bilirubin 0.4 0.2 - 1.2 mg/dL   Alkaline Phosphatase 64 39 - 117 U/L   AST 24 0 - 37 U/L   ALT 30 0 - 53 U/L   Total Protein 7.8 6.0 - 8.3 g/dL   Albumin 4.4 3.5 - 5.2 g/dL   GFR 00.17 >49.44 mL/min   Calcium 9.9 8.4 - 10.5 mg/dL  Hemoglobin H6P  Result Value Ref Range   Hgb A1c MFr Bld 5.9 4.6 - 6.5 %  Lipid panel  Result Value Ref Range   Cholesterol 173 0 - 200 mg/dL   Triglycerides 591.6 (H) 0.0 - 149.0 mg/dL   HDL 38.46 (L) >65.99 mg/dL   VLDL 35.7 0.0 - 01.7 mg/dL   LDL Cholesterol 793 (H) 0 - 99 mg/dL   Total CHOL/HDL Ratio 5    NonHDL 140.56   Troponin I (High Sensitivity)  Result Value Ref Range   High Sens Troponin I 6 2 - 17 ng/L

## 2021-11-09 ENCOUNTER — Encounter: Payer: Self-pay | Admitting: Family Medicine

## 2021-11-09 ENCOUNTER — Telehealth: Payer: Self-pay

## 2021-11-09 NOTE — Telephone Encounter (Signed)
Pt's insurance does not cover PPI coverage- requires OTC trial.

## 2021-11-22 ENCOUNTER — Other Ambulatory Visit: Payer: BC Managed Care – PPO

## 2022-09-20 ENCOUNTER — Telehealth: Payer: Self-pay

## 2022-09-20 ENCOUNTER — Emergency Department (HOSPITAL_BASED_OUTPATIENT_CLINIC_OR_DEPARTMENT_OTHER)
Admission: EM | Admit: 2022-09-20 | Discharge: 2022-09-20 | Disposition: A | Discharge status: Home / Self Care | Payer: Self-pay | Attending: Emergency Medicine | Admitting: Emergency Medicine

## 2022-09-20 ENCOUNTER — Emergency Department (HOSPITAL_BASED_OUTPATIENT_CLINIC_OR_DEPARTMENT_OTHER): Payer: Self-pay

## 2022-09-20 ENCOUNTER — Encounter (HOSPITAL_BASED_OUTPATIENT_CLINIC_OR_DEPARTMENT_OTHER): Payer: Self-pay

## 2022-09-20 DIAGNOSIS — R079 Chest pain, unspecified: Secondary | ICD-10-CM | POA: Insufficient documentation

## 2022-09-20 DIAGNOSIS — R109 Unspecified abdominal pain: Secondary | ICD-10-CM | POA: Insufficient documentation

## 2022-09-20 DIAGNOSIS — K219 Gastro-esophageal reflux disease without esophagitis: Secondary | ICD-10-CM | POA: Insufficient documentation

## 2022-09-20 LAB — HEPATIC FUNCTION PANEL
ALT: 44 U/L (ref 0–44)
AST: 34 U/L (ref 15–41)
Albumin: 4.4 g/dL (ref 3.5–5.0)
Alkaline Phosphatase: 63 U/L (ref 38–126)
Bilirubin, Direct: <0.1 mg/dL (ref 0.0–0.2)
Total Bilirubin: 0.2 mg/dL — ABNORMAL LOW (ref 0.3–1.2)
Total Protein: 8.1 g/dL (ref 6.5–8.1)

## 2022-09-20 LAB — CBC
HCT: 47.6 % (ref 39.0–52.0)
Hemoglobin: 16.2 g/dL (ref 13.0–17.0)
MCH: 30.5 pg (ref 26.0–34.0)
MCHC: 34 g/dL (ref 30.0–36.0)
MCV: 89.6 fL (ref 80.0–100.0)
Platelets: 302 10*3/uL (ref 150–400)
RBC: 5.31 MIL/uL (ref 4.22–5.81)
RDW: 12.2 % (ref 11.5–15.5)
WBC: 10.5 10*3/uL (ref 4.0–10.5)
nRBC: 0 % (ref 0.0–0.2)

## 2022-09-20 LAB — BASIC METABOLIC PANEL
Anion gap: 9 (ref 5–15)
BUN: 17 mg/dL (ref 6–20)
CO2: 26 mmol/L (ref 22–32)
Calcium: 9.2 mg/dL (ref 8.9–10.3)
Chloride: 103 mmol/L (ref 98–111)
Creatinine, Ser: 1.33 mg/dL — ABNORMAL HIGH (ref 0.61–1.24)
GFR, Estimated: >60 mL/min (ref >60)
Glucose, Bld: 100 mg/dL — ABNORMAL HIGH (ref 70–99)
Potassium: 3.6 mmol/L (ref 3.5–5.1)
Sodium: 138 mmol/L (ref 135–145)

## 2022-09-20 LAB — TROPONIN I (HIGH SENSITIVITY): Troponin I (High Sensitivity): 7 ng/L (ref <18)

## 2022-09-20 LAB — LIPASE, BLOOD: Lipase: 36 U/L (ref 11–51)

## 2022-09-20 MED ORDER — ALUM & MAG HYDROXIDE-SIMETH 200-200-20 MG/5ML PO SUSP
30.0000 mL | Freq: Once | ORAL | Status: AC
  Administered 2022-09-20: 30 mL via ORAL
  Filled 2022-09-20: qty 30

## 2022-09-20 MED ORDER — SUCRALFATE 1 G PO TABS: 1.0000 g | ORAL_TABLET | Freq: Three times a day (TID) | ORAL | 0 refills | Status: DC

## 2022-09-20 MED ORDER — SODIUM CHLORIDE 0.9 % IV BOLUS
1000.0000 mL | Freq: Once | INTRAVENOUS | Status: AC
  Administered 2022-09-20: 1000 mL via INTRAVENOUS

## 2022-09-20 MED ORDER — OMEPRAZOLE 40 MG PO CPDR: 40.0000 mg | DELAYED_RELEASE_CAPSULE | Freq: Every day | ORAL | 0 refills | Status: DC

## 2022-09-20 MED ORDER — FAMOTIDINE 20 MG PO TABS: 20.0000 mg | ORAL_TABLET | Freq: Two times a day (BID) | ORAL | 0 refills | Status: DC

## 2022-09-20 NOTE — Discharge Instructions (Addendum)
Avoid alcohol and NSAIDs such as ibuprofen, Advil, Aleve, etc.  You are being put on multiple treatments to help with gastritis.  However you should also follow-up with a gastroenterology specialist, and you are being referred to Dr. Dulce Sellar.  Call their office today or tomorrow to set up an outpatient appointment.  Follow-up with your primary care physician.  If you develop fever, new or worsening abdominal pain, chest pain, vomiting, vomiting blood, bloody or black stools, or any other new/concerning symptoms then return to the ER or call 911.

## 2022-09-20 NOTE — ED Triage Notes (Signed)
Pt reports right side chest pain and mid back pain x 3 days. Denies N/V/D. Pt says having issues with GERD. Hurts worse with movement. Denies injury

## 2022-09-20 NOTE — Telephone Encounter (Signed)
Pt In ED currently .

## 2022-09-20 NOTE — Telephone Encounter (Signed)
Pt is being seen at ED.

## 2022-09-20 NOTE — Telephone Encounter (Signed)
Initial Comment Caller states wants an appt for today; woke up 4am to use the bath room; had RT severe back pain; OTC acid reflux meds not working; Chest pain; Translation No Nurse Assessment Nurse: Freida Busman, RN, Jonette Eva Date/Time (Eastern Time): 09/20/2022 7:41:20 AM Confirm and document reason for call. If symptomatic, describe symptoms. ---Caller states back pain at 4am chest pain unsure if two seperate issues has had this issue before taking a deep breathe increases pain no fever urination at time of pain no blood in urine Does the patient have any new or worsening symptoms? ---Yes Will a triage be completed? ---Yes Related visit to physician within the last 2 weeks? ---No Does the PT have any chronic conditions? (i.e. diabetes, asthma, this includes High risk factors for pregnancy, etc.) ---Yes List chronic conditions. ---allergie Is this a behavioral health or substance abuse call? ---No Guidelines Guideline Title Affirmed Question Affirmed Notes Nurse Date/Time (Eastern Time) Chest Pain [1] Chest pain lasts > 5 minutes AND [2] age > 30 AND [3] one or more cardiac risk factors (e.g., diabetes, high blood pressure, high cholesterol, smoker, Freida Busman, RN, Jonette Eva 09/20/2022 7:44:09 AM PLEASE NOTE: All timestamps contained within this report are represented as [place] Standard Time. CONFIDENTIALTY NOTICE: This fax transmission is intended only for the addressee. It contains information that is legally privileged, confidential or otherwise protected from use or disclosure. If you are not the intended recipient, you are strictly prohibited from reviewing, disclosing, copying using or disseminating any of this information or taking any action in reliance on or regarding this information. If you have received this fax in error, please notify us immediately by telephone so that we can arrange for its return to [place]. Phone: [phone], Toll-Free: [phone], Fax: [phone] Page: 2 of  2 Call Id: [id number] Guidelines Guideline Title Affirmed Question Affirmed Notes Nurse Date/Time Lamount Cohen Time) or strong family history of heart disease) Disp. Time Lamount Cohen Time) Disposition Final User 09/20/2022 7:37:44 AM Send to Urgent Hansel Starling 09/20/2022 7:45:44 AM Call EMS 911 Now Yes Freida Busman, RN, Jonette Eva 09/20/2022 7:46:40 AM 911 Outcome Documentation Dalton, RN, Jonette Eva Reason: pov Final Disposition 09/20/2022 7:45:44 AM Call EMS 911 Now Yes Freida Busman, RN, Jonette Eva Caller Disagree/Comply Disagree Caller Understands Yes PreDisposition Call Doctor Care Advice Given Per Guideline CALL EMS 911 NOW: * Immediate medical attention is needed. You need to hang up and call 911 (or an ambulance). CARE ADVICE given per Chest Pain (Adult) guideline

## 2022-09-20 NOTE — ED Provider Notes (Signed)
[facility] Provider Note   CSN: [id number] Arrival date & time: 09/20/22  0820     History  Chief Complaint  Patient presents with   Chest Pain    Johnny Yang is a 42 y.o. male.  HPI 42 year old male with a history of GERD presents with right-sided abdominal pain.  Started a few days ago.  At first it felt like a typical GERD flare for him.  However the pain has progressively gotten worse.  It hurts to breathe, talk, and move.  No cough or shortness of breath.  It is in his inferior chest/upper abdomen.  No vomiting, diarrhea, or black/bloody stools.  Eating does not seem to make it particularly worse.  He went and got some Prilosec over-the-counter has been taking that but it has not seemed to help.  Pain right now is pretty severe. It also hurts in his back on the same side.  He states he has been dealing with pain recurrences in this area on and off every several months. No recent travel or leg swelling.   Home Medications Prior to Admission medications   Medication Sig Start Date End Date Taking? Authorizing Provider  famotidine (PEPCID) 20 MG tablet Take 1 tablet (20 mg total) by mouth 2 (two) times daily. 09/20/22  Yes Pricilla Loveless, MD  omeprazole (PRILOSEC) 40 MG capsule Take 1 capsule (40 mg total) by mouth daily. 09/20/22  Yes Pricilla Loveless, MD  sucralfate (CARAFATE) 1 g tablet Take 1 tablet (1 g total) by mouth 4 (four) times daily -  with meals and at bedtime. 09/20/22  Yes Pricilla Loveless, MD  albuterol (VENTOLIN HFA) 108 (90 Base) MCG/ACT inhaler Inhale 2 puffs into the lungs every 6 (six) hours as needed for wheezing or shortness of breath. 11/08/21   Copland, Gwenlyn Found, MD  cetirizine (ZYRTEC) 10 MG tablet Take 10 mg by mouth daily.    [provider]      Allergies    Patient has no known allergies.    Review of Systems   Review of Systems  Constitutional:  Negative for fever.  Respiratory:  Negative  for cough and shortness of breath.   Cardiovascular:  Negative for chest pain and leg swelling.  Gastrointestinal:  Positive for abdominal pain. Negative for vomiting.  Genitourinary:  Negative for dysuria and hematuria.  Musculoskeletal:  Positive for back pain.    Physical Exam Updated Vital Signs BP (!) 143/98   Pulse 78   Temp 98.1 F (36.7 C) (Oral)   Resp (!) 22   Ht 6\' 3"  (1.905 m)   Wt 113.4 kg   SpO2 100%   BMI 31.25 kg/m  Physical Exam Vitals and nursing note reviewed.  Constitutional:      General: He is not in acute distress.    Appearance: He is well-developed. He is not ill-appearing or diaphoretic.  HENT:     Head: Normocephalic and atraumatic.  Cardiovascular:     Rate and Rhythm: Normal rate and regular rhythm.     Heart sounds: Normal heart sounds.  Pulmonary:     Effort: Pulmonary effort is normal.     Breath sounds: Normal breath sounds.  Abdominal:     Palpations: Abdomen is soft.     Tenderness: There is abdominal tenderness in the right upper quadrant. There is no right CVA tenderness or left CVA tenderness.  Musculoskeletal:     Thoracic back: No tenderness.     Lumbar  back: No tenderness.     Right lower leg: No tenderness. No edema.     Left lower leg: No tenderness. No edema.  Skin:    General: Skin is warm and dry.  Neurological:     Mental Status: He is alert.     ED Results / Procedures / Treatments   Labs (all labs ordered are listed, but only abnormal results are displayed) Labs Reviewed  BASIC METABOLIC PANEL - Abnormal; Notable for the following components:      Result Value   Glucose, Bld 100 (*)    Creatinine, Ser 1.33 (*)    All other components within normal limits  HEPATIC FUNCTION PANEL - Abnormal; Notable for the following components:   Total Bilirubin 0.2 (*)    All other components within normal limits  CBC  LIPASE, BLOOD  TROPONIN I (HIGH SENSITIVITY)    EKG EKG Interpretation Date/Time:  Tuesday September 20 2022 08:30:37 EDT Ventricular Rate:  82 PR Interval:  143 QRS Duration:  91 QT Interval:  344 QTC Calculation: 402 R Axis:   60  Text Interpretation: Sinus rhythm Consider left ventricular hypertrophy no significant change since 2020 Confirmed by Pricilla Loveless [phone]) on 09/20/2022 8:37:02 AM  Radiology US Abdomen Limited RUQ (LIVER/GB)  Result Date: 09/20/2022 CLINICAL DATA:  [id number] Right sided abdominal pain [id number] EXAM: ULTRASOUND ABDOMEN LIMITED RIGHT UPPER QUADRANT COMPARISON:  None Available. FINDINGS: Gallbladder: No gallstones or wall thickening visualized. No sonographic Murphy sign noted by sonographer. Common bile duct: Diameter: Less than 4 mm Liver: No focal lesion identified. Within normal limits in parenchymal echogenicity. Portal vein is patent on color Doppler imaging with normal direction of blood flow towards the liver. Other: None. IMPRESSION: *Unremarkable right upper quadrant ultrasound exam. Electronically Signed   By: Jules Schick M.D.   On: 09/20/2022 09:47   DG Chest 2 View  Result Date: 09/20/2022 CLINICAL DATA:  cp EXAM: CHEST - 2 VIEW COMPARISON:  11/08/2021. FINDINGS: Bilateral lung fields are clear. Bilateral costophrenic angles are clear. Normal cardio-mediastinal silhouette. No acute osseous abnormalities. The soft tissues are within normal limits. IMPRESSION: No active cardiopulmonary disease. Electronically Signed   By: Jules Schick M.D.   On: 09/20/2022 09:02    Procedures Procedures    Medications Ordered in ED Medications  sodium chloride 0.9 % bolus 1,000 mL (1,000 mLs Intravenous New Bag/Given 09/20/22 0900)  alum & mag hydroxide-simeth (MAALOX/MYLANTA) 200-200-20 MG/5ML suspension 30 mL (30 mLs Oral Given 09/20/22 8469)    ED Course/ Medical Decision Making/ A&P                             Medical Decision Making Amount and/or Complexity of Data Reviewed Labs: ordered.    Details: Normal WBC and hemoglobin.  LFTs unremarkable.  Lipase  normal.  Troponin normal. Radiology: ordered and independent interpretation performed.    Details: No CHF.  No cholecystitis. ECG/medicine tests: ordered and independent interpretation performed.    Details: No ischemia.  Risk OTC drugs.   Patient states he has had this type of abdominal pain multiple times each year for several years.  He saw his doctor last month and I reviewed that visit and there was concern for H. pylori versus gastritis.  He states that the medicines he was given (Prilosec and Carafate) seemed to help.  He was originally prescribed Protonix but insurance did not cover it so he took [employer], fashion/clothing.  I suspect the  Carafate helped the acute symptoms.  He does feel little better with a GI cocktail.  There is no evidence of cholecystitis.  His pain resolved right upper abdomen so I think appendicitis is highly unlikely.  No urinary symptoms and this is a recurrent issue that responds to GI meds I think ureteral stone/kidney disease is unlikely.  I highly doubt ACS.  Doubt PE and he is PERC negative.  No signs or symptoms of bleeding.  Will discharge home with increased Prilosec dose, Pepcid, and Carafate.  Will give GI referral.  Discussed return precautions.        Final Clinical Impression(s) / ED Diagnoses Final diagnoses:  Right sided abdominal pain    Rx / DC Orders ED Discharge Orders          Ordered    omeprazole (PRILOSEC) 40 MG capsule  Daily        09/20/22 1041    famotidine (PEPCID) 20 MG tablet  2 times daily        09/20/22 1041    sucralfate (CARAFATE) 1 g tablet  3 times daily with meals & bedtime        09/20/22 1041              Pricilla Loveless, MD 09/20/22 1043

## 2023-02-28 ENCOUNTER — Telehealth: Payer: Self-pay | Admitting: Family

## 2023-02-28 NOTE — Telephone Encounter (Signed)
LVM advising patient is overdue for physical. Requested call back to schedule or use mychart.

## 2023-05-21 NOTE — Progress Notes (Unsigned)
 [facility] [address] [place], [place] [address] [phone]  Date:  05/22/2023   Name:  Johnny Yang   DOB:  01-09-1981   MRN:  [id number]  PCP:  Olive Bass, FNP    Chief Complaint: chest pain/ reflux (Coughing, lying down is painful. Pt has been worked up for this before. He says this is an issue 1-2 times per year. This episode has lasted x 1 week. )   History of Present Illness:  Johnny Yang is a 43 y.o. very pleasant male patient who presents with the following:  Pt seen today with concern of chest discomfort which he thinks is likely acid reflux He has dealt with this for the last few years He notes this tends to flare up now and again- I saw him for this issue in 9/23- it will flare up intermittently every 6 months or so  It flared up again about a week ago-no trigger that he is aware of He is burping a lot and has a burning pain in his chest No vomiting No diarrhea He is able to eat  He notes pain in the epigastric and right upper quadrant Hurts more with cough, laying down  Not worse after eating- not worse with any particualr foods-however he is trying to avoid acidic foods He is able swallow ok-nothing getting stuck or regurgitating  He has tried some OTC meds for acid- like alka seltzer- did not help however   He has not seen GI as of yet  He does have dyslipidemia and does smoke MJ-no tobacco  Blood pressure is elevated today, looking back and has been elevated the last couple of visits.  He would be happy to start blood pressure medication if necessary  BP Readings from Last 3 Encounters:  05/22/23 (!) 162/100  09/20/22 (!) 163/92  11/08/21 (!) 145/89   He is not aware of a family history of HTN  Patient notes he is not having any pain now.  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   Allergic conjunctivitis 08/18/2017   Hallux limitus of right foot 01/19/2016   Testicular pain 05/28/2015   ANXIETY STATE, UNSPECIFIED 10/31/2008   Seasonal and perennial allergic rhinitis 10/31/2008   Migraine equivalent 10/31/2008    Past Medical History:  Diagnosis Date   Allergy    seasonal   GERD (gastroesophageal reflux disease)    Urticaria     Past Surgical History:  Procedure Laterality Date   FOOT SURGERY Left    TONSILLECTOMY AND ADENOIDECTOMY      Social History   Tobacco Use   Smoking status: Light Smoker    Types: Cigars   Smokeless tobacco: Never   Tobacco comments:    2x per month- cigar  Vaping Use   Vaping status: Never Used  Substance Use Topics   Alcohol use: Yes    Comment:  occasionally as 2-3 beverages 2-3 X/ week   Drug use: Yes    Types: Marijuana    Family History  Problem Relation Age of Onset   Cancer Paternal Aunt  ovarian   Allergic rhinitis Mother    Allergic rhinitis Father    Asthma Neg Hx    Angioedema Neg Hx    Eczema Neg Hx    Immunodeficiency Neg Hx    Urticaria Neg Hx     No Known Allergies  Medication list has been reviewed and updated.  Current Outpatient Medications on File Prior to Visit  Medication Sig Dispense Refill   albuterol (VENTOLIN HFA) 108 (90 Base) MCG/ACT inhaler Inhale 2 puffs into the lungs every 6 (six) hours as needed for wheezing or shortness of breath. (Patient not taking: Reported on 05/22/2023) 1 each 0   cetirizine (ZYRTEC) 10 MG tablet Take 10 mg by mouth daily. (Patient not taking: Reported on 05/22/2023)     Current Facility-Administered Medications on File Prior to Visit  Medication Dose Route Frequency Provider  Last Rate Last Admin   alum & mag hydroxide-simeth (MAALOX/MYLANTA) 200-200-20 MG/5ML suspension 30 mL  30 mL Oral Once Keane Martelli, Gwenlyn Found, MD        Review of Systems:  As per HPI- otherwise negative.   Physical Examination: Vitals:   05/22/23 1148  BP: (!) 162/100  Pulse: 82  Resp: 18  Temp: 98 F (36.7 C)  SpO2: 98%   Vitals:   05/22/23 1148  Weight: 256 lb 6.4 oz (116.3 kg)  Height: 6\' 2"  (1.88 m)   Body mass index is 32.92 kg/m. Ideal Body Weight: Weight in (lb) to have BMI = 25: 194.3  GEN: no acute distress.  Looks well, tall build HEENT: Atraumatic, Normocephalic.  Bilateral TM wnl, oropharynx normal.  PEERL,EOMI.   Ears and Nose: No external deformity. CV: RRR, No M/G/R. No JVD. No thrill. No extra heart sounds. PULM: CTA B, no wheezes, crackles, rhonchi. No retractions. No resp. distress. No accessory muscle use. ABD: S, NT, ND, +BS. No rebound. No HSM.  Negative Murphy sign.  I am not able to reproduce his discomfort right now EXTR: No c/c/e PSYCH: Normally interactive. Conversant.    EKG: NSR, compared with tracing from 7/24 no significant change noted   Assessment and Plan: Chest pain, unspecified type - Plan: EKG 12-Lead, Troponin I (High Sensitivity), DG Chest 2 View  Epigastric pain - Plan: US Abdomen Limited RUQ (LIVER/GB), Ambulatory referral to Gastroenterology, CBC, Comprehensive metabolic panel, sucralfate (CARAFATE) 1 g tablet, pantoprazole (PROTONIX) 40 MG tablet  Screening for hyperlipidemia - Plan: Hemoglobin A1c  Screening for diabetes mellitus - Plan: Lipid panel  Elevated blood pressure reading - Plan: losartan (COZAAR) 25 MG tablet  Patient seen today with right sided chest pain and epigastric/right upper quadrant discomfort.  He has had similar symptoms in the past, thought due to GERD.  EKG is reassuring.  Will check a troponin and a chest x-ray.  Assuming these look okay we will treat presumptively for gastritis/GERD with PPI and  Carafate.  Referral to GI at this point, he may need an upper GI scope.  Will also obtain a right upper quadrant ultrasound  Note mild hypertension; start on losartan 25 mg.  He has a home blood pressure cuff and will get a few readings and be back in touch with me  Routine blood work pending as above  Signed Abbe Amsterdam, MD  Received labs and chest x-ray, message to patient  Results for orders placed or performed in visit on 05/22/23  CBC   Collection Time: 05/22/23 12:30 PM  Result Value Ref Range   WBC 9.1 4.0 - 10.5 K/uL  RBC 4.89 4.22 - 5.81 Mil/uL   Platelets 326.0 150.0 - 400.0 K/uL   Hemoglobin 15.1 13.0 - 17.0 g/dL   HCT 08.6 57.8 - 46.9 %   MCV 93.2 78.0 - 100.0 fl   MCHC 33.2 30.0 - 36.0 g/dL   RDW 62.9 52.8 - 41.3 %  Comprehensive metabolic panel   Collection Time: 05/22/23 12:30 PM  Result Value Ref Range   Sodium 140 135 - 145 mEq/L   Potassium 4.2 3.5 - 5.1 mEq/L   Chloride 105 96 - 112 mEq/L   CO2 28 19 - 32 mEq/L   Glucose, Bld 85 70 - 99 mg/dL   BUN 17 6 - 23 mg/dL   Creatinine, Ser 2.44 0.40 - 1.50 mg/dL   Total Bilirubin 0.4 0.2 - 1.2 mg/dL   Alkaline Phosphatase 74 39 - 117 U/L   AST 29 0 - 37 U/L   ALT 43 0 - 53 U/L   Total Protein 7.4 6.0 - 8.3 g/dL   Albumin 4.7 3.5 - 5.2 g/dL   GFR 01.02 (L) >72.53 mL/min   Calcium 9.5 8.4 - 10.5 mg/dL  Hemoglobin G6Y   Collection Time: 05/22/23 12:30 PM  Result Value Ref Range   Hgb A1c MFr Bld 6.0 4.6 - 6.5 %  Lipid panel   Collection Time: 05/22/23 12:30 PM  Result Value Ref Range   Cholesterol 173 0 - 200 mg/dL   Triglycerides 403.4 (H) 0.0 - 149.0 mg/dL   HDL 74.25 (L) >95.63 mg/dL   VLDL 87.5 (H) 0.0 - 64.3 mg/dL   LDL Cholesterol 91 0 - 99 mg/dL   Total CHOL/HDL Ratio 5    NonHDL 137.56   Troponin I (High Sensitivity)   Collection Time: 05/22/23 12:30 PM  Result Value Ref Range   High Sens Troponin I 8 2 - 17 ng/L    DG Chest 2 View Result Date: 05/22/2023 CLINICAL DATA:  Chest pain  EXAM: CHEST - 2 VIEW COMPARISON:  09/20/2022 FINDINGS: The heart size and mediastinal contours are within normal limits. Both lungs are clear. The visualized skeletal structures are unremarkable. IMPRESSION: No active cardiopulmonary disease. Electronically Signed   By: Judie Petit.  Shick M.D.   On: 05/22/2023 13:08

## 2023-05-22 ENCOUNTER — Encounter: Payer: Self-pay | Admitting: Family Medicine

## 2023-05-22 ENCOUNTER — Ambulatory Visit: Admitting: Family Medicine

## 2023-05-22 ENCOUNTER — Ambulatory Visit (HOSPITAL_BASED_OUTPATIENT_CLINIC_OR_DEPARTMENT_OTHER)
Admission: RE | Admit: 2023-05-22 | Discharge: 2023-05-22 | Disposition: A | Discharge status: Home / Self Care | Source: Ambulatory Visit | Attending: Family Medicine | Admitting: Family Medicine

## 2023-05-22 ENCOUNTER — Telehealth: Payer: Self-pay

## 2023-05-22 VITALS — BP 150/90 | HR 82 | Temp 98.0°F | Resp 18 | Ht 74.0 in | Wt 256.4 lb

## 2023-05-22 DIAGNOSIS — R1013 Epigastric pain: Secondary | ICD-10-CM | POA: Diagnosis not present

## 2023-05-22 DIAGNOSIS — Z13220 Encounter for screening for lipoid disorders: Secondary | ICD-10-CM | POA: Diagnosis not present

## 2023-05-22 DIAGNOSIS — Z131 Encounter for screening for diabetes mellitus: Secondary | ICD-10-CM | POA: Diagnosis not present

## 2023-05-22 DIAGNOSIS — R079 Chest pain, unspecified: Secondary | ICD-10-CM

## 2023-05-22 DIAGNOSIS — R030 Elevated blood-pressure reading, without diagnosis of hypertension: Secondary | ICD-10-CM

## 2023-05-22 LAB — COMPREHENSIVE METABOLIC PANEL
ALT: 43 U/L (ref 0–53)
AST: 29 U/L (ref 0–37)
Albumin: 4.7 g/dL (ref 3.5–5.2)
Alkaline Phosphatase: 74 U/L (ref 39–117)
BUN: 17 mg/dL (ref 6–23)
CO2: 28 meq/L (ref 19–32)
Calcium: 9.5 mg/dL (ref 8.4–10.5)
Chloride: 105 meq/L (ref 96–112)
Creatinine, Ser: 1.49 mg/dL (ref 0.40–1.50)
GFR: 58.34 mL/min — ABNORMAL LOW (ref >60.00)
Glucose, Bld: 85 mg/dL (ref 70–99)
Potassium: 4.2 meq/L (ref 3.5–5.1)
Sodium: 140 meq/L (ref 135–145)
Total Bilirubin: 0.4 mg/dL (ref 0.2–1.2)
Total Protein: 7.4 g/dL (ref 6.0–8.3)

## 2023-05-22 LAB — CBC
HCT: 45.5 % (ref 39.0–52.0)
Hemoglobin: 15.1 g/dL (ref 13.0–17.0)
MCHC: 33.2 g/dL (ref 30.0–36.0)
MCV: 93.2 fl (ref 78.0–100.0)
Platelets: 326 10*3/uL (ref 150.0–400.0)
RBC: 4.89 Mil/uL (ref 4.22–5.81)
RDW: 13 % (ref 11.5–15.5)
WBC: 9.1 10*3/uL (ref 4.0–10.5)

## 2023-05-22 LAB — TROPONIN I (HIGH SENSITIVITY): High Sens Troponin I: 8 ng/L (ref 2–17)

## 2023-05-22 LAB — LIPID PANEL
Cholesterol: 173 mg/dL (ref 0–200)
HDL: 35.1 mg/dL — ABNORMAL LOW (ref >39.00)
LDL Cholesterol: 91 mg/dL (ref 0–99)
NonHDL: 137.56
Total CHOL/HDL Ratio: 5
Triglycerides: 234 mg/dL — ABNORMAL HIGH (ref 0.0–149.0)
VLDL: 46.8 mg/dL — ABNORMAL HIGH (ref 0.0–40.0)

## 2023-05-22 LAB — HEMOGLOBIN A1C: Hgb A1c MFr Bld: 6 % (ref 4.6–6.5)

## 2023-05-22 MED ORDER — PANTOPRAZOLE SODIUM 40 MG PO TBEC
40.0000 mg | DELAYED_RELEASE_TABLET | Freq: Every day | ORAL | 3 refills | Status: DC
Start: 2023-05-22 — End: 2023-07-20

## 2023-05-22 MED ORDER — SUCRALFATE 1 G PO TABS
1.0000 g | ORAL_TABLET | Freq: Three times a day (TID) | ORAL | 0 refills | Status: DC
Start: 2023-05-22 — End: 2023-05-30

## 2023-05-22 MED ORDER — LOSARTAN POTASSIUM 25 MG PO TABS: 25.0000 mg | ORAL_TABLET | Freq: Every day | ORAL | 3 refills | Status: DC

## 2023-05-22 NOTE — Telephone Encounter (Signed)
 Called pt to see if he could come in office since to be seen vs the VV.   I left a vm for pt to rtn call.

## 2023-05-22 NOTE — Patient Instructions (Addendum)
 It was good to see you today!  I will be in touch with your labs asap Please stop by imaging on the ground floor to have a chest x-ray and set up a liver/ gallbladder ultrasound Let's try carafate (with meals and bedtime for 10 days) and the pantoprazole for about 30 days I will get you seen by GI asap  Please let me know if not getting better   Your BP has been elevated the last few visits- let's try a low dose of BP medication for you, losartan 25 mg daily  Please check your BP a few times over the next 2 weeks and let me know how it looks on this medication

## 2023-05-24 ENCOUNTER — Encounter: Payer: Self-pay | Admitting: Family Medicine

## 2023-05-24 ENCOUNTER — Ambulatory Visit (HOSPITAL_BASED_OUTPATIENT_CLINIC_OR_DEPARTMENT_OTHER)
Admission: RE | Admit: 2023-05-24 | Discharge: 2023-05-24 | Disposition: A | Discharge status: Home / Self Care | Source: Ambulatory Visit | Attending: Family Medicine | Admitting: Family Medicine

## 2023-05-24 DIAGNOSIS — R1013 Epigastric pain: Secondary | ICD-10-CM | POA: Insufficient documentation

## 2023-05-29 ENCOUNTER — Other Ambulatory Visit: Payer: Self-pay | Admitting: Family Medicine

## 2023-05-29 DIAGNOSIS — R1013 Epigastric pain: Secondary | ICD-10-CM

## 2023-06-06 ENCOUNTER — Other Ambulatory Visit: Payer: Self-pay | Admitting: Family

## 2023-06-06 DIAGNOSIS — R1013 Epigastric pain: Secondary | ICD-10-CM

## 2023-07-20 ENCOUNTER — Encounter: Payer: Self-pay | Admitting: Nurse Practitioner

## 2023-07-20 ENCOUNTER — Ambulatory Visit: Admitting: Nurse Practitioner

## 2023-07-20 VITALS — BP 134/80 | HR 86 | Ht 74.0 in | Wt 245.0 lb

## 2023-07-20 DIAGNOSIS — R142 Eructation: Secondary | ICD-10-CM | POA: Diagnosis not present

## 2023-07-20 DIAGNOSIS — K219 Gastro-esophageal reflux disease without esophagitis: Secondary | ICD-10-CM | POA: Diagnosis not present

## 2023-07-20 DIAGNOSIS — R1011 Right upper quadrant pain: Secondary | ICD-10-CM

## 2023-07-20 MED ORDER — PANTOPRAZOLE SODIUM 40 MG PO TBEC
40.0000 mg | DELAYED_RELEASE_TABLET | Freq: Every day | ORAL | 1 refills | Status: DC
Start: 2023-07-20 — End: 2023-07-31

## 2023-07-20 NOTE — Progress Notes (Signed)
 07/20/2023 Johnny Yang [id number]    CHIEF COMPLAINT: Upper abdominal pain  HISTORY OF PRESENT ILLNESS: Johnny Yang is a 43 year old male with a past medical history of anxiety, arthritis, obesity, hypertension, possible sleep apnea and GERD. He presents to our office today as referred by Dr. Jessica Koplan for further evaluation regarding epigastric pain and RUQ pain. He describes having intermittent episodes of epigastric and RUQ pain. He was previously seen in the ED 09/20/2022 due to having right sided abdominal pain, possibly triggered after he drank orange juice and lemonade and a biscuit from [employer]. LFTs were normal. RUQ sonogram showed a normal gallbladder and liver. He was discharged home on Prilosec 40 mg daily, Pepcid  20 mg twice daily and Carafate  1 g p.o. 4 times daily.  Two months ago, he had severe sharp epigastric and RUQ pain which radiated to the chest without nausea or vomiting which lasted for 1 week, no specific triggers. He was seen by Dr. Allison Arena 05/22/2023 for further evaluation and at that time he also noted having intermittent heartburn and increased burping.  EKG and chest x-ray were unremarkable. He was prescribed Pantoprazole  40 mg daily and Sucralfate  1 g 4 times daily which he took for 1 month. He denies having any further epigastric or RUQ pain.  No noticeable heartburn at this time.  No dysphagia. Bowel movements are normal. No bloody or black stools. Infrequent BC powder use for headaches. He wishes to avoid any procedure which requires sedation but if medically necessary he will consider an upper endoscopy.     Latest Ref Rng & Units 05/22/2023   12:30 PM 09/20/2022    8:34 AM 11/08/2021    2:43 PM  CBC  WBC 4.0 - 10.5 K/uL 9.1  10.5  9.1   Hemoglobin 13.0 - 17.0 g/dL 78.2  95.6  21.3   Hematocrit 39.0 - 52.0 % 45.5  47.6  44.3   Platelets 150.0 - 400.0 K/uL 326.0  302  285.0        Latest Ref Rng & Units 05/22/2023   12:30 PM 09/20/2022     8:34 AM 11/08/2021    2:43 PM  CMP  Glucose 70 - 99 mg/dL 85  086  95   BUN 6 - 23 mg/dL 17  17  18    Creatinine 0.40 - 1.50 mg/dL 5.78  4.69  6.29   Sodium 135 - 145 mEq/L 140  138  139   Potassium 3.5 - 5.1 mEq/L 4.2  3.6  3.8   Chloride 96 - 112 mEq/L 105  103  104   CO2 19 - 32 mEq/L 28  26  27    Calcium 8.4 - 10.5 mg/dL 9.5  9.2  9.9   Total Protein 6.0 - 8.3 g/dL 7.4  8.1  7.8   Total Bilirubin 0.2 - 1.2 mg/dL 0.4  0.2  0.4   Alkaline Phos 39 - 117 U/L 74  63  64   AST 0 - 37 U/L 29  34  24   ALT 0 - 53 U/L 43  44  30      RUQ sonogram 05/24/2023:  FINDINGS: Gallbladder:   No gallstones or wall thickening visualized. No sonographic Murphy sign noted by sonographer.   Common bile duct:   Diameter: 2.3 mm.   Liver:   No focal lesion identified. Within normal limits in parenchymal echogenicity. Portal vein is patent on color Doppler imaging with normal direction of blood flow  towards the liver.   Other: None.   IMPRESSION: Normal right upper quadrant ultrasound.  RUQ sonogram 09/20/2022: Unremarkable right upper quadrant ultrasound exam.  Past Medical History:  Diagnosis Date   Allergy    seasonal   GERD (gastroesophageal reflux disease)    Urticaria    Past Surgical History:  Procedure Laterality Date   FOOT SURGERY Left    TONSILLECTOMY AND ADENOIDECTOMY     Social History: He is married. He has one sone and one daughter.  No tobacco use. He drinks one beer or mixed drinks a few times weekly or less. He smokes marijuana almost every day.    Family History: Father had stomach issues. Paternal aunt had ovarian.  No known family history of esophageal, gastric or colon cancer.  No Known Allergies   Outpatient Encounter Medications as of 07/20/2023  Medication Sig   albuterol  (VENTOLIN  HFA) 108 (90 Base) MCG/ACT inhaler Inhale 2 puffs into the lungs every 6 (six) hours as needed for wheezing or shortness of breath. (Patient not taking: Reported on  05/22/2023)   cetirizine (ZYRTEC) 10 MG tablet Take 10 mg by mouth daily. (Patient not taking: Reported on 05/22/2023)   losartan  (COZAAR ) 25 MG tablet Take 1 tablet (25 mg total) by mouth daily.   pantoprazole  (PROTONIX ) 40 MG tablet Take 1 tablet (40 mg total) by mouth daily.   sucralfate  (CARAFATE ) 1 g tablet TAKE 1 TABLET(1 GRAM) BY MOUTH FOUR TIMES DAILY AT BEDTIME WITH MEALS   Facility-Administered Encounter Medications as of 07/20/2023  Medication   alum & mag hydroxide-simeth (MAALOX/MYLANTA) 200-200-20 MG/5ML suspension 30 mL   REVIEW OF SYSTEMS:  Gen: Denies fever, sweats or chills. No weight loss.  CV: Denies chest pain, palpitations or edema. Resp: Denies cough, shortness of breath of hemoptysis.  GI: See HPI. GU: + Excessive urination.  MS: Denies joint pain, muscles aches or weakness. Derm: Denies rash, itchiness, skin lesions or unhealing ulcers. Psych: Denies depression, anxiety, memory loss or confusion. Heme: Denies bruising, easy bleeding. Neuro: + Headaches. Endo:  Denies any problems with DM, thyroid or adrenal function.  PHYSICAL EXAM: BP 134/80   Pulse 86   Ht 6\' 2"  (1.88 m)   Wt 245 lb (111.1 kg)   BMI 31.46 kg/m   General: 43 year old male in no acute distress. Head: Normocephalic and atraumatic. Eyes:  Sclerae non-icteric, conjunctive pink. Ears: Normal auditory acuity. Mouth: Dentition intact. No ulcers or lesions.  Neck: Supple, no lymphadenopathy or thyromegaly.  Lungs: Clear bilaterally to auscultation without wheezes, crackles or rhonchi. Heart: Regular rate and rhythm. No murmur, rub or gallop appreciated.  Abdomen: Soft, nontender, nondistended. No masses. No hepatosplenomegaly. Normoactive bowel sounds x 4 quadrants.  Rectal: Deferred. Musculoskeletal: Symmetrical with no gross deformities. Skin: Warm and dry. No rash or lesions on visible extremities. Extremities: No edema. Neurological: Alert oriented x 4, no focal deficits.   Psychological: Alert and cooperative. Normal mood and affect.  ASSESSMENT AND PLAN:  43 year old male with intermittent epigastric/RUQ pain, acid reflux and increased burping.  RUQ sonogram x 2 showed a normal liver and gallbladder.  Normal LFTs.  Infrequent NSAID use. - Pantoprazole  40 mg daily to be taken 30 minutes before breakfast daily - I discussed scheduling an EGD to rule out PUD, benefits and risks discussed including risk with sedation, risk of bleeding, perforation and infection. Patient does not wish to pursue an EGD at this time, however, he agrees to pursue an upper endoscopy if his upper abdominal pains recur -  [facility] HIDA scan to rule out biliary dyskinesia if symptoms recur - GERD diet - Avoid NSAIDs - Follow-up in office in 2 to 3 months and as needed    CC:  Adra Alanis,*

## 2023-07-20 NOTE — Patient Instructions (Addendum)
 We have sent the following medications to your pharmacy for you to pick up at your convenience: pantoprazole   _______________________________________________________  If your blood pressure at your visit was 140/90 or greater, please contact your primary care physician to follow up on this.  _______________________________________________________  If you are age 41 or older, your body mass index should be between 23-30. Your Body mass index is 31.46 kg/m. If this is out of the aforementioned range listed, please consider follow up with your Primary Care Provider.  If you are age 83 or younger, your body mass index should be between 19-25. Your Body mass index is 31.46 kg/m. If this is out of the aformentioned range listed, please consider follow up with your Primary Care Provider.   ________________________________________________________  The [facility] providers would like to encourage you to use MYCHART to communicate with providers for non-urgent requests or questions.  Due to long hold times on the telephone, sending your provider a message by [facility] may be a faster and more efficient way to get a response.  Please allow 48 business hours for a response.  Please remember that this is for non-urgent requests.  _______________________________________________________

## 2023-07-20 NOTE — Progress Notes (Signed)
 Addendum: Reviewed and agree with assessment and management plan. Asha Grumbine, Carie Caddy, MD

## 2023-07-30 ENCOUNTER — Other Ambulatory Visit: Payer: Self-pay | Admitting: Family Medicine

## 2023-07-30 DIAGNOSIS — R030 Elevated blood-pressure reading, without diagnosis of hypertension: Secondary | ICD-10-CM

## 2023-07-30 DIAGNOSIS — K219 Gastro-esophageal reflux disease without esophagitis: Secondary | ICD-10-CM

## 2023-10-20 ENCOUNTER — Ambulatory Visit: Admitting: Nurse Practitioner

## 2023-11-30 ENCOUNTER — Encounter: Payer: Self-pay | Admitting: Family Medicine

## 2023-12-04 ENCOUNTER — Ambulatory Visit: Payer: Self-pay | Admitting: Family Medicine
